# Patient Record
Sex: Female | Born: 1940 | Hispanic: Yes | Marital: Married
Health system: Midwestern US, Academic
[De-identification: ages and names within clinical notes are randomized; demographics above are authoritative.]

---

## 2016-12-25 ENCOUNTER — Encounter: Admit: 2016-12-25 | Discharge: 2016-12-26 | Payer: BC Managed Care – PPO

## 2016-12-25 ENCOUNTER — Encounter: Admit: 2016-12-25 | Discharge: 2016-12-25 | Payer: BC Managed Care – PPO

## 2016-12-25 DIAGNOSIS — R69 Illness, unspecified: Principal | ICD-10-CM

## 2017-01-09 ENCOUNTER — Encounter: Admit: 2017-01-09 | Discharge: 2017-01-10 | Payer: BC Managed Care – PPO

## 2017-01-09 ENCOUNTER — Encounter: Admit: 2017-01-09 | Discharge: 2017-01-09 | Payer: BC Managed Care – PPO

## 2017-01-09 DIAGNOSIS — R69 Illness, unspecified: Principal | ICD-10-CM

## 2017-04-23 ENCOUNTER — Encounter: Admit: 2017-04-23 | Discharge: 2017-04-23 | Payer: BC Managed Care – PPO

## 2017-04-24 ENCOUNTER — Encounter: Admit: 2017-04-24 | Discharge: 2017-04-24 | Payer: BC Managed Care – PPO

## 2017-04-24 DIAGNOSIS — R69 Illness, unspecified: Principal | ICD-10-CM

## 2017-05-01 ENCOUNTER — Encounter: Admit: 2017-05-01 | Discharge: 2017-05-01 | Payer: MEDICARE

## 2017-05-08 ENCOUNTER — Encounter: Admit: 2017-05-08 | Discharge: 2017-05-08 | Payer: BC Managed Care – PPO

## 2017-05-08 DIAGNOSIS — C50919 Malignant neoplasm of unspecified site of unspecified female breast: Principal | ICD-10-CM

## 2017-05-08 DIAGNOSIS — R51 Headache: ICD-10-CM

## 2017-05-08 DIAGNOSIS — B999 Unspecified infectious disease: ICD-10-CM

## 2017-05-08 DIAGNOSIS — G459 Transient cerebral ischemic attack, unspecified: ICD-10-CM

## 2017-05-08 DIAGNOSIS — I10 Essential (primary) hypertension: ICD-10-CM

## 2017-05-08 DIAGNOSIS — M797 Fibromyalgia: ICD-10-CM

## 2017-05-08 DIAGNOSIS — M549 Dorsalgia, unspecified: ICD-10-CM

## 2017-05-08 DIAGNOSIS — M810 Age-related osteoporosis without current pathological fracture: ICD-10-CM

## 2017-05-08 DIAGNOSIS — Z170 Estrogen receptor positive status [ER+]: ICD-10-CM

## 2017-05-08 DIAGNOSIS — D0511 Intraductal carcinoma in situ of right breast: Principal | ICD-10-CM

## 2017-05-08 DIAGNOSIS — Z780 Asymptomatic menopausal state: ICD-10-CM

## 2017-05-08 DIAGNOSIS — M1990 Unspecified osteoarthritis, unspecified site: ICD-10-CM

## 2017-05-08 DIAGNOSIS — Z853 Personal history of malignant neoplasm of breast: ICD-10-CM

## 2017-05-08 DIAGNOSIS — C801 Malignant (primary) neoplasm, unspecified: ICD-10-CM

## 2017-05-08 DIAGNOSIS — E119 Type 2 diabetes mellitus without complications: ICD-10-CM

## 2017-05-08 DIAGNOSIS — E039 Hypothyroidism, unspecified: ICD-10-CM

## 2017-05-08 DIAGNOSIS — I358 Other nonrheumatic aortic valve disorders: ICD-10-CM

## 2017-05-08 DIAGNOSIS — I253 Aneurysm of heart: ICD-10-CM

## 2017-05-08 DIAGNOSIS — E785 Hyperlipidemia, unspecified: ICD-10-CM

## 2017-05-08 DIAGNOSIS — H547 Unspecified visual loss: ICD-10-CM

## 2017-05-08 DIAGNOSIS — C539 Malignant neoplasm of cervix uteri, unspecified: ICD-10-CM

## 2017-05-08 DIAGNOSIS — Z9012 Acquired absence of left breast and nipple: ICD-10-CM

## 2017-05-08 DIAGNOSIS — Z7982 Long term (current) use of aspirin: ICD-10-CM

## 2017-05-08 DIAGNOSIS — K8020 Calculus of gallbladder without cholecystitis without obstruction: ICD-10-CM

## 2017-05-09 NOTE — Progress Notes
Name: Kimberly [place]          MRN: [id number]      DOB: 04/10/1941      AGE: 76 y.o.   DATE OF SERVICE: 05/08/2017    Subjective:             Reason for Visit:Medical oncology consultation for recently diagnosed right breast DCIS    Requesting physician: Dr. Aline Brochure    Primary care physician: Dr. Orson Mitchell    Heme/Onc Care      Kim[place]ly [place] Kimberly [place] is a 76 y.o. female.     Cancer Staging  Ductal carcinoma in situ (DCIS) of right breast  Staging form: Breast, AJCC 8th Edition  - Clinical stage from 05/08/2017: Stage 0 (cTis (DCIS), cN0, cM0, ER: Positive, PR: Positive, HER2: Not assessed ) - Signed by Kimberly Guest, MD on 05/08/2017      History of Present Illness  Kimberly [place] is a 76 year old African-American female resident of [place], [place] who is well known to me from previous history of left breast cancer in December 2000 who was referred to Dr. Verlin [place] due to newly diagnosed right breast DCIS.    She felt some right breast discomfort and underwent right breast diagnostic mammogram 12/25/16 at [facility].  Is revealed a 12 mm mass with microcalcifications in the outer and inferior aspect of the right breast.  On ultrasound this measured 11 x 9 x 12 mm at 7:00 5 cm from the nipple.  It contained calcifications.    She underwent ultrasound-guided right breast biopsy in [place] on 01/09/17.  This revealed DCIS, ER 90-100%, PR 60-70%.    She was scheduled for lumpectomy but unfortunately her 68-year-old son Gregary Signs was by a car and killed in early [place] 2018.  She rescheduled her procedure and underwent lumpectomy with Dr. Delynn Mitchell 04/17/17.  Revealed DCIS with negative margins.    She wished to reestablish with me due to her previous treatment with me in 2000 and Dr. Delynn [place] arranged for consultation.    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  Read with left mastectomy followed by adjuvant chemotherapy then 5 years of tamoxifen which transition to Femara.  She completed the Femara in 2011 or 2012.  History of hypothyroidism, diabetes, fibromyalgia, hypertension, hyperlipidemia, rheumatoid arthritis, TIA, previous cervical discectomy with fusion.    Social history: She is a never smoker.  She is married and accompanied by her husband [place].  They live in [place].  He is a Optician, dispensing for the [employer].  They have a 19 year old son Kimberly [place] and a 108 year old daughter [place].  Their 71 year old son Gregary Signs unfortunately was killed in a pedestrian car accident in [place] 2018.    Family history: Sister had non-Hodgkin's lymphoma of the throat.  A maternal cousin died of colon cancer.  No family members with breast cancer or ovarian cancer that she is aware of.  Has some relatives in [place].       Review of Systems  Diffusely chronically positive.  Decreased activity, fatigue, chills.  Congestion, dental problem causing facial swelling.  Eyeredness and photophobia.  Chest tightness cough shortness of breath wheezing.  Constipation at times.  Urinary urgency at times.  Back pain, neck pain at times.  Dizziness at times.  Headaches at times.  Weakness at times.  Some nervousness at times.  Objective:         ??? albuterol (VENTOLIN HFA, PROAIR HFA) 90 mcg/Actuation inhaler Inhale 2 Puffs by mouth Every 6 Hours as needed for Wheezing.   ??? aspirin 325 mg PO tablet Take 325 mg by mouth daily.   ??? baclofen (LIORESAL) 10 mg tablet Take 1 Tab by mouth three times daily.   ??? carvedilol (COREG) 25 mg tablet Take 1 Tab by mouth twice daily. (Patient taking differently: Take 40 mg by mouth twice daily.)   ??? diltiazem CD (CARDIZEM CD) 360 mg PO capsule Take 1 Cap by mouth daily.   ??? furosemide (LASIX) 40 mg tablet Take by mouth Daily.    ??? glimepiride (AMARYL) 4 mg tablet Take 2 Tabs by mouth Every Morning. ??? homatropine 2 % OP Drop Insert or Apply 1 Drop to right eye as directed as Needed.   ??? hydrALAzine (APRESOLINE) 50 mg tablet Take 1 Tab by mouth three times daily.   ??? Hydrocortisone 2 % TP Lotn Apply  to affected area.   ??? ibuprofen (ADVIL) 200 mg PO tablet Take 200 mg by mouth every 6 hours as needed.   ??? levothyroxine (SYNTHROID) 100 mcg tablet Take 100 mcg by mouth Daily.   ??? lisinopril (PRINIVIL, ZESTRIL) 40 mg tablet Take 1 Tab by mouth daily.   ??? nitroglycerin (NITROQUICK) 0.4 mg tablet Place 1 Tab under tongue every 5 minutes as needed for Chest Pain.   ??? potassium chloride SR (K-DUR) 20 mEq tablet Take 4 Tabs by mouth daily.   ??? PREDNISOLONE ACETATE (PRED FORTE OP) Place 1 Drop into or around eye(s) Twice Daily. Left eye   ??? PREDNISOLONE ACETATE (PRED FORTE OP) Place 1 Drop into or around eye(s) Three Times Daily. Right eye      Vitals:    05/08/17 0940   BP: 184/82   Pulse: 76   Resp: 16   Temp: 36.6 ???C (97.9 ???F)   TempSrc: Oral   SpO2: 97%   Weight: 73.2 kg (161 lb 6.4 oz)   Height: 165.1 cm (65)     Body mass index is 26.86 kg/m???.     Pain Score: Five  Pain Loc: Generalized      Pain Addressed:  Current regimen working to control pain.    Patient Evaluated for a Clinical Trial: No treatment clinical trial available for this patient.     [place] Cooperative Oncology Group performance status is 1, Restricted in physically strenuous activity but ambulatory and able to carry out work of a light or sedentary nature, e.g., light house work, office work.     Physical Exam     Very pleasant conversant Afro-American female in no distress.  HEENT: Poor dentition.  Neck: No adenopathy.  Cardiac: Regular.  Pulmonary: Clear.  Rest: Left breast surgically absent.  No chest wall nodularity or erythema.  Right breast with nicely healed lumpectomy incision at 7 o'clock position.  Slightly tender.  No palpable abnormality.  No axillary adenopathy.  Extremities: No lymphedema.  Lower extremities with trace bilateral lower extremity edema and chronic venous stasis changes.     Assessment and Plan:  Newly diagnosed right breast ductal carcinoma in situ.  She is status post lumpectomy with negative margins.  I discussed adjuvant radiotherapy with the patient and will arrange consultation with Dr. Briscoe Burns.  At completion of radiotherapy we will plan adjuvant endocrine therapy with an aromatase inhibitor.  Tamoxifen would be  contraindicated due to previous TIA.  We will obtain a  baseline DEXA scan in [place].    Due to her personal history of bilateral breast carcinoma and will arrange for genetic consultation.    I will see Shanyah in follow-up at completion of her radiotherapy to initiate adjuvant endocrine therapy with Femara or Arimidex.  We will then plan nurse practitioner follow-up for survivorship visit.    Past history of stage II node positive left breast invasive ductal carcinoma diagnosed in December 2000.  Doing well with no evidence of recurrence.    Greater than 45 minutes was spent with the patient and her husband and all their questions were answered to her satisfaction.  Greater than 45 minutes was spent reviewing outside records including radiology studies, pathology, and office notes.

## 2017-05-10 ENCOUNTER — Encounter: Admit: 2017-05-10 | Discharge: 2017-05-10 | Payer: BC Managed Care – PPO

## 2017-05-11 NOTE — Progress Notes
RADIATION ONCOLOGY PRE CONSULTATION DOCUMENTATION      CHIEF COMPLAINT:   ??? 1.7 cm, pTis N0 M0 , Stage 0,  Ductal Carcinoma of the in- Situ of the Right Breast.    HISTORY OF PRESENT ILLNESS:   Kimberly [place] is a 76 y.o. female resident of [place], [place] with a history of left breast cancer in December 2000.  Kimberly [place] felt some right breast discomfort and underwent a right breast diagnostic mammogram on 12/25/16 at [facility].  The mammogram revealed a 12 mm mass with microcalcifications in the outer and inferior aspect of the right breast.  On ultrasound this measured 11 x 9 x 12 mm at the 7:00 position, 5 cm from the nipple.  It contained calcifications.   She underwent an ultrasound- guided right breast biopsy in [place] on 01/09/17.  This revealed DCIS, ER 90-100%, PR 60-70%.  She was scheduled for a lumpectomy,  but, unfortunately her 72 year old son, [place], was killed in a pedestrian - car accident in early November 2018.   Kimberly [place] underwent a right breast lumpectomy on 04/17/17.  The pathology results are summarized in the table below.    04/17/17  Right Breast Lumpectomy  Right Breast, 7:00 Position, 5 cm From the Nipple.     Results   Tumor Size  Cell Type  Grade  Type of DCIS  Margins 1.7 cm  DCIS  Grade 2.  Cribriform Type.  Negative Margins (0.3 cm).   Nodes Sentinel Node Biopsy N/A.   Assays Estrogen Receptors  Progesterone Receptors ER+ (90 - 100%)  PR+ (60 - 70%)          LABORATORY:     IMAGING:     PATHOLOGY:    ASSESSMENT:   Bilateral Metachronous Breast Cancer.  1.7 cm, pTis N0 M0 , Stage 0,  Ductal Carcinoma of the in- Situ of the Right Breast (2018).  Location: Right Breast, 7:00 Position, 5 cm From the Nipple.  Grade 2.  Cribriform Type.  Negative Margins (0.3 cm).  ER+ (90 - 100%)  PR+ (60 - 70%)    pT1c N1 M0 , Invasive Ductal Carcinoma of the Left Breast (2001).  Negative Margins.  2 of 29 Nodes Involved.  Largest Nodal Met:  0.1 cm.  ER+ / PR+. PREVIOUS PROCEDURES / TREATMENTS:  06/06/99  Left Modified Radical Mastectomy [place]).  2011       Completed Tamoxifen followed by [place]).  01/09/17  Right Breast Core Biopsy ([place], [place]).  04/17/17  Right Breast Lumpectomy ([place]).    PLAN:  ??? Right Breast Hypofractionated Radiation to 5205 cGy.  ??? Hormonal Therapy per [place] post Radiation Therapy.

## 2017-05-13 ENCOUNTER — Ambulatory Visit: Admit: 2017-05-13 | Discharge: 2017-05-27 | Payer: BC Managed Care – PPO

## 2017-05-16 ENCOUNTER — Ambulatory Visit: Admit: 2017-05-16 | Discharge: 2017-05-16 | Payer: BC Managed Care – PPO

## 2017-05-16 ENCOUNTER — Encounter: Admit: 2017-05-16 | Discharge: 2017-05-16 | Payer: BC Managed Care – PPO

## 2017-05-16 DIAGNOSIS — E785 Hyperlipidemia, unspecified: ICD-10-CM

## 2017-05-16 DIAGNOSIS — K8020 Calculus of gallbladder without cholecystitis without obstruction: ICD-10-CM

## 2017-05-16 DIAGNOSIS — E119 Type 2 diabetes mellitus without complications: ICD-10-CM

## 2017-05-16 DIAGNOSIS — R51 Headache: ICD-10-CM

## 2017-05-16 DIAGNOSIS — I358 Other nonrheumatic aortic valve disorders: ICD-10-CM

## 2017-05-16 DIAGNOSIS — I253 Aneurysm of heart: ICD-10-CM

## 2017-05-16 DIAGNOSIS — I10 Essential (primary) hypertension: ICD-10-CM

## 2017-05-16 DIAGNOSIS — M549 Dorsalgia, unspecified: ICD-10-CM

## 2017-05-16 DIAGNOSIS — B999 Unspecified infectious disease: ICD-10-CM

## 2017-05-16 DIAGNOSIS — IMO0002 Squamous cell carcinoma: ICD-10-CM

## 2017-05-16 DIAGNOSIS — M810 Age-related osteoporosis without current pathological fracture: ICD-10-CM

## 2017-05-16 DIAGNOSIS — E039 Hypothyroidism, unspecified: ICD-10-CM

## 2017-05-16 DIAGNOSIS — M797 Fibromyalgia: ICD-10-CM

## 2017-05-16 DIAGNOSIS — C539 Malignant neoplasm of cervix uteri, unspecified: ICD-10-CM

## 2017-05-16 DIAGNOSIS — M1990 Unspecified osteoarthritis, unspecified site: ICD-10-CM

## 2017-05-16 DIAGNOSIS — H547 Unspecified visual loss: ICD-10-CM

## 2017-05-16 DIAGNOSIS — C801 Malignant (primary) neoplasm, unspecified: ICD-10-CM

## 2017-05-16 DIAGNOSIS — G459 Transient cerebral ischemic attack, unspecified: ICD-10-CM

## 2017-05-16 DIAGNOSIS — C50919 Malignant neoplasm of unspecified site of unspecified female breast: Principal | ICD-10-CM

## 2017-05-23 ENCOUNTER — Encounter: Admit: 2017-05-23 | Discharge: 2017-05-23 | Payer: BC Managed Care – PPO

## 2017-05-27 DIAGNOSIS — D0511 Intraductal carcinoma in situ of right breast: Principal | ICD-10-CM

## 2017-05-28 ENCOUNTER — Encounter: Admit: 2017-05-28 | Discharge: 2017-05-28 | Payer: BC Managed Care – PPO

## 2017-05-28 ENCOUNTER — Ambulatory Visit: Admit: 2017-05-28 | Discharge: 2017-06-12 | Payer: BC Managed Care – PPO

## 2017-05-30 ENCOUNTER — Encounter: Admit: 2017-05-30 | Discharge: 2017-05-30 | Payer: BC Managed Care – PPO

## 2017-06-05 ENCOUNTER — Encounter: Admit: 2017-06-05 | Discharge: 2017-06-05 | Payer: BC Managed Care – PPO

## 2017-06-06 ENCOUNTER — Encounter: Admit: 2017-06-06 | Discharge: 2017-06-06 | Payer: BC Managed Care – PPO

## 2017-06-09 ENCOUNTER — Encounter: Admit: 2017-06-09 | Discharge: 2017-06-09 | Payer: BC Managed Care – PPO

## 2017-06-09 DIAGNOSIS — D0511 Intraductal carcinoma in situ of right breast: Principal | ICD-10-CM

## 2017-06-10 ENCOUNTER — Encounter: Admit: 2017-06-10 | Discharge: 2017-06-10 | Payer: BC Managed Care – PPO

## 2017-06-11 ENCOUNTER — Encounter: Admit: 2017-06-11 | Discharge: 2017-06-11 | Payer: BC Managed Care – PPO

## 2017-06-12 ENCOUNTER — Encounter: Admit: 2017-06-12 | Discharge: 2017-06-12 | Payer: BC Managed Care – PPO

## 2017-06-13 ENCOUNTER — Ambulatory Visit: Admit: 2017-06-13 | Discharge: 2017-06-27 | Payer: BC Managed Care – PPO

## 2017-06-13 ENCOUNTER — Encounter: Admit: 2017-06-13 | Discharge: 2017-06-13 | Payer: BC Managed Care – PPO

## 2017-06-16 ENCOUNTER — Encounter: Admit: 2017-06-16 | Discharge: 2017-06-16 | Payer: BC Managed Care – PPO

## 2017-06-17 ENCOUNTER — Encounter: Admit: 2017-06-17 | Discharge: 2017-06-17 | Payer: BC Managed Care – PPO

## 2017-06-18 ENCOUNTER — Encounter: Admit: 2017-06-18 | Discharge: 2017-06-18 | Payer: BC Managed Care – PPO

## 2017-06-19 ENCOUNTER — Encounter: Admit: 2017-06-19 | Discharge: 2017-06-19 | Payer: BC Managed Care – PPO

## 2017-06-20 ENCOUNTER — Encounter: Admit: 2017-06-20 | Discharge: 2017-06-20 | Payer: BC Managed Care – PPO

## 2017-06-23 ENCOUNTER — Encounter: Admit: 2017-06-23 | Discharge: 2017-06-23 | Payer: BC Managed Care – PPO

## 2017-06-24 ENCOUNTER — Encounter: Admit: 2017-06-24 | Discharge: 2017-06-24 | Payer: BC Managed Care – PPO

## 2017-06-25 ENCOUNTER — Encounter: Admit: 2017-06-25 | Discharge: 2017-06-25 | Payer: BC Managed Care – PPO

## 2017-06-26 ENCOUNTER — Encounter: Admit: 2017-06-26 | Discharge: 2017-06-26 | Payer: BC Managed Care – PPO

## 2017-06-27 DIAGNOSIS — D0511 Intraductal carcinoma in situ of right breast: Principal | ICD-10-CM

## 2017-06-28 ENCOUNTER — Ambulatory Visit: Admit: 2017-06-28 | Discharge: 2017-07-10 | Payer: BC Managed Care – PPO

## 2017-06-30 ENCOUNTER — Encounter: Admit: 2017-06-30 | Discharge: 2017-06-30 | Payer: BC Managed Care – PPO

## 2017-06-30 DIAGNOSIS — G459 Transient cerebral ischemic attack, unspecified: ICD-10-CM

## 2017-06-30 DIAGNOSIS — M810 Age-related osteoporosis without current pathological fracture: ICD-10-CM

## 2017-06-30 DIAGNOSIS — I358 Other nonrheumatic aortic valve disorders: ICD-10-CM

## 2017-06-30 DIAGNOSIS — I253 Aneurysm of heart: ICD-10-CM

## 2017-06-30 DIAGNOSIS — E119 Type 2 diabetes mellitus without complications: ICD-10-CM

## 2017-06-30 DIAGNOSIS — E785 Hyperlipidemia, unspecified: ICD-10-CM

## 2017-06-30 DIAGNOSIS — R51 Headache: ICD-10-CM

## 2017-06-30 DIAGNOSIS — K8020 Calculus of gallbladder without cholecystitis without obstruction: ICD-10-CM

## 2017-06-30 DIAGNOSIS — M549 Dorsalgia, unspecified: ICD-10-CM

## 2017-06-30 DIAGNOSIS — E039 Hypothyroidism, unspecified: ICD-10-CM

## 2017-06-30 DIAGNOSIS — I10 Essential (primary) hypertension: ICD-10-CM

## 2017-06-30 DIAGNOSIS — C801 Malignant (primary) neoplasm, unspecified: ICD-10-CM

## 2017-06-30 DIAGNOSIS — M1990 Unspecified osteoarthritis, unspecified site: ICD-10-CM

## 2017-06-30 DIAGNOSIS — H547 Unspecified visual loss: ICD-10-CM

## 2017-06-30 DIAGNOSIS — M797 Fibromyalgia: ICD-10-CM

## 2017-06-30 DIAGNOSIS — C50919 Malignant neoplasm of unspecified site of unspecified female breast: Principal | ICD-10-CM

## 2017-06-30 DIAGNOSIS — B999 Unspecified infectious disease: ICD-10-CM

## 2017-06-30 DIAGNOSIS — C539 Malignant neoplasm of cervix uteri, unspecified: ICD-10-CM

## 2017-07-01 ENCOUNTER — Encounter: Admit: 2017-07-01 | Discharge: 2017-07-01 | Payer: BC Managed Care – PPO

## 2017-07-02 ENCOUNTER — Encounter: Admit: 2017-07-02 | Discharge: 2017-07-02 | Payer: BC Managed Care – PPO

## 2017-07-03 ENCOUNTER — Encounter: Admit: 2017-07-03 | Discharge: 2017-07-03 | Payer: BC Managed Care – PPO

## 2017-07-04 ENCOUNTER — Encounter: Admit: 2017-07-04 | Discharge: 2017-07-04 | Payer: BC Managed Care – PPO

## 2017-07-10 DIAGNOSIS — D0511 Intraductal carcinoma in situ of right breast: Principal | ICD-10-CM

## 2017-07-14 ENCOUNTER — Encounter: Admit: 2017-07-14 | Discharge: 2017-07-14 | Payer: BC Managed Care – PPO

## 2017-07-14 DIAGNOSIS — R51 Headache: ICD-10-CM

## 2017-07-14 DIAGNOSIS — E119 Type 2 diabetes mellitus without complications: ICD-10-CM

## 2017-07-14 DIAGNOSIS — C50912 Malignant neoplasm of unspecified site of left female breast: ICD-10-CM

## 2017-07-14 DIAGNOSIS — D0511 Intraductal carcinoma in situ of right breast: Principal | ICD-10-CM

## 2017-07-14 DIAGNOSIS — C539 Malignant neoplasm of cervix uteri, unspecified: ICD-10-CM

## 2017-07-14 DIAGNOSIS — C801 Malignant (primary) neoplasm, unspecified: ICD-10-CM

## 2017-07-14 DIAGNOSIS — M797 Fibromyalgia: ICD-10-CM

## 2017-07-14 DIAGNOSIS — M810 Age-related osteoporosis without current pathological fracture: ICD-10-CM

## 2017-07-14 DIAGNOSIS — M549 Dorsalgia, unspecified: ICD-10-CM

## 2017-07-14 DIAGNOSIS — E039 Hypothyroidism, unspecified: ICD-10-CM

## 2017-07-14 DIAGNOSIS — Z9013 Acquired absence of bilateral breasts and nipples: ICD-10-CM

## 2017-07-14 DIAGNOSIS — H547 Unspecified visual loss: ICD-10-CM

## 2017-07-14 DIAGNOSIS — I10 Essential (primary) hypertension: ICD-10-CM

## 2017-07-14 DIAGNOSIS — I358 Other nonrheumatic aortic valve disorders: ICD-10-CM

## 2017-07-14 DIAGNOSIS — K8020 Calculus of gallbladder without cholecystitis without obstruction: ICD-10-CM

## 2017-07-14 DIAGNOSIS — Z170 Estrogen receptor positive status [ER+]: ICD-10-CM

## 2017-07-14 DIAGNOSIS — I253 Aneurysm of heart: ICD-10-CM

## 2017-07-14 DIAGNOSIS — G459 Transient cerebral ischemic attack, unspecified: ICD-10-CM

## 2017-07-14 DIAGNOSIS — B999 Unspecified infectious disease: ICD-10-CM

## 2017-07-14 DIAGNOSIS — E785 Hyperlipidemia, unspecified: ICD-10-CM

## 2017-07-14 DIAGNOSIS — M1990 Unspecified osteoarthritis, unspecified site: ICD-10-CM

## 2017-07-14 DIAGNOSIS — C50919 Malignant neoplasm of unspecified site of unspecified female breast: Principal | ICD-10-CM

## 2017-07-14 DIAGNOSIS — Z79811 Long term (current) use of aromatase inhibitors: ICD-10-CM

## 2017-07-14 MED ORDER — LETROZOLE 2.5 MG PO TAB
2.5 mg | ORAL_TABLET | Freq: Every day | ORAL | 3 refills | 16.50000 days | Status: AC
Start: 2017-07-14 — End: 2018-08-04

## 2017-07-26 ENCOUNTER — Ambulatory Visit: Admit: 2017-07-26 | Discharge: 2017-08-10 | Payer: BC Managed Care – PPO

## 2017-08-11 ENCOUNTER — Ambulatory Visit: Admit: 2017-08-11 | Discharge: 2017-08-25 | Payer: BC Managed Care – PPO

## 2017-08-20 ENCOUNTER — Encounter: Admit: 2017-08-20 | Discharge: 2017-08-20 | Payer: BC Managed Care – PPO

## 2017-08-20 DIAGNOSIS — M1990 Unspecified osteoarthritis, unspecified site: ICD-10-CM

## 2017-08-20 DIAGNOSIS — K8020 Calculus of gallbladder without cholecystitis without obstruction: ICD-10-CM

## 2017-08-20 DIAGNOSIS — M810 Age-related osteoporosis without current pathological fracture: ICD-10-CM

## 2017-08-20 DIAGNOSIS — M549 Dorsalgia, unspecified: ICD-10-CM

## 2017-08-20 DIAGNOSIS — I253 Aneurysm of heart: ICD-10-CM

## 2017-08-20 DIAGNOSIS — C539 Malignant neoplasm of cervix uteri, unspecified: ICD-10-CM

## 2017-08-20 DIAGNOSIS — E039 Hypothyroidism, unspecified: ICD-10-CM

## 2017-08-20 DIAGNOSIS — E785 Hyperlipidemia, unspecified: ICD-10-CM

## 2017-08-20 DIAGNOSIS — M797 Fibromyalgia: ICD-10-CM

## 2017-08-20 DIAGNOSIS — C801 Malignant (primary) neoplasm, unspecified: ICD-10-CM

## 2017-08-20 DIAGNOSIS — B999 Unspecified infectious disease: ICD-10-CM

## 2017-08-20 DIAGNOSIS — I10 Essential (primary) hypertension: ICD-10-CM

## 2017-08-20 DIAGNOSIS — R51 Headache: ICD-10-CM

## 2017-08-20 DIAGNOSIS — I358 Other nonrheumatic aortic valve disorders: ICD-10-CM

## 2017-08-20 DIAGNOSIS — H547 Unspecified visual loss: ICD-10-CM

## 2017-08-20 DIAGNOSIS — G459 Transient cerebral ischemic attack, unspecified: ICD-10-CM

## 2017-08-20 DIAGNOSIS — E119 Type 2 diabetes mellitus without complications: ICD-10-CM

## 2017-08-20 DIAGNOSIS — C50919 Malignant neoplasm of unspecified site of unspecified female breast: Principal | ICD-10-CM

## 2017-08-25 ENCOUNTER — Ambulatory Visit: Admit: 2017-08-20 | Discharge: 2017-08-20 | Payer: BC Managed Care – PPO

## 2017-08-25 DIAGNOSIS — D0511 Intraductal carcinoma in situ of right breast: ICD-10-CM

## 2017-08-25 DIAGNOSIS — Z08 Encounter for follow-up examination after completed treatment for malignant neoplasm: Principal | ICD-10-CM

## 2017-09-15 ENCOUNTER — Encounter: Admit: 2017-09-15 | Discharge: 2017-09-15 | Payer: BC Managed Care – PPO

## 2018-02-12 ENCOUNTER — Encounter: Admit: 2018-02-12 | Discharge: 2018-02-12 | Payer: BC Managed Care – PPO

## 2018-02-12 DIAGNOSIS — C773 Secondary and unspecified malignant neoplasm of axilla and upper limb lymph nodes: ICD-10-CM

## 2018-02-12 DIAGNOSIS — D0511 Intraductal carcinoma in situ of right breast: Secondary | ICD-10-CM

## 2018-02-12 DIAGNOSIS — R51 Headache: ICD-10-CM

## 2018-02-12 DIAGNOSIS — I358 Other nonrheumatic aortic valve disorders: ICD-10-CM

## 2018-02-12 DIAGNOSIS — E11649 Type 2 diabetes mellitus with hypoglycemia without coma: ICD-10-CM

## 2018-02-12 DIAGNOSIS — R0781 Pleurodynia: ICD-10-CM

## 2018-02-12 DIAGNOSIS — Z170 Estrogen receptor positive status [ER+]: ICD-10-CM

## 2018-02-12 DIAGNOSIS — R42 Dizziness and giddiness: ICD-10-CM

## 2018-02-12 DIAGNOSIS — M79601 Pain in right arm: ICD-10-CM

## 2018-02-12 DIAGNOSIS — Z79811 Long term (current) use of aromatase inhibitors: ICD-10-CM

## 2018-02-12 DIAGNOSIS — E119 Type 2 diabetes mellitus without complications: ICD-10-CM

## 2018-02-12 DIAGNOSIS — C50919 Malignant neoplasm of unspecified site of unspecified female breast: Principal | ICD-10-CM

## 2018-02-12 DIAGNOSIS — I10 Essential (primary) hypertension: ICD-10-CM

## 2018-02-12 DIAGNOSIS — N644 Mastodynia: ICD-10-CM

## 2018-02-12 DIAGNOSIS — J449 Chronic obstructive pulmonary disease, unspecified: ICD-10-CM

## 2018-02-12 DIAGNOSIS — Z9012 Acquired absence of left breast and nipple: ICD-10-CM

## 2018-02-12 DIAGNOSIS — Z7984 Long term (current) use of oral hypoglycemic drugs: ICD-10-CM

## 2018-02-12 DIAGNOSIS — J431 Panlobular emphysema: ICD-10-CM

## 2018-02-12 DIAGNOSIS — C50912 Malignant neoplasm of unspecified site of left female breast: ICD-10-CM

## 2018-02-12 DIAGNOSIS — E785 Hyperlipidemia, unspecified: ICD-10-CM

## 2018-02-12 DIAGNOSIS — K8020 Calculus of gallbladder without cholecystitis without obstruction: ICD-10-CM

## 2018-02-12 DIAGNOSIS — R350 Frequency of micturition: ICD-10-CM

## 2018-02-12 DIAGNOSIS — N39498 Other specified urinary incontinence: ICD-10-CM

## 2018-02-12 DIAGNOSIS — Z853 Personal history of malignant neoplasm of breast: ICD-10-CM

## 2018-02-12 DIAGNOSIS — G459 Transient cerebral ischemic attack, unspecified: ICD-10-CM

## 2018-02-12 DIAGNOSIS — I253 Aneurysm of heart: ICD-10-CM

## 2018-02-12 DIAGNOSIS — M549 Dorsalgia, unspecified: ICD-10-CM

## 2018-02-12 DIAGNOSIS — M797 Fibromyalgia: ICD-10-CM

## 2018-02-12 DIAGNOSIS — E039 Hypothyroidism, unspecified: ICD-10-CM

## 2018-02-12 DIAGNOSIS — B999 Unspecified infectious disease: ICD-10-CM

## 2018-02-12 DIAGNOSIS — M1990 Unspecified osteoarthritis, unspecified site: ICD-10-CM

## 2018-02-12 DIAGNOSIS — C801 Malignant (primary) neoplasm, unspecified: ICD-10-CM

## 2018-02-12 DIAGNOSIS — H547 Unspecified visual loss: ICD-10-CM

## 2018-02-12 DIAGNOSIS — M810 Age-related osteoporosis without current pathological fracture: ICD-10-CM

## 2018-02-12 DIAGNOSIS — C539 Malignant neoplasm of cervix uteri, unspecified: ICD-10-CM

## 2018-02-12 LAB — CBC AND DIFF
Lab: 1 % (ref >60)
Lab: 13 g/dL (ref 12.0–15.0)
Lab: 14 % (ref 11–15)
Lab: 177 10*3/uL (ref 150–400)
Lab: 2.5 10*3/uL (ref 1.8–7.0)
Lab: 29 pg (ref 26–34)
Lab: 33 g/dL (ref 32.0–36.0)
Lab: 39 % (ref 36–45)
Lab: 4.3 10*3/uL — ABNORMAL LOW (ref >60)
Lab: 4.4 M/UL — ABNORMAL HIGH (ref >60)
Lab: 5 % (ref >60)
Lab: 58 % (ref 41–77)
Lab: 8.6 FL (ref 7–11)
Lab: 87 FL (ref 80–100)
Lab: 9 % (ref 4–12)

## 2018-02-13 LAB — COMPREHENSIVE METABOLIC PANEL
Lab: 140 MMOL/L (ref 137–147)
Lab: 3.2 MMOL/L — ABNORMAL LOW (ref 3.5–5.1)

## 2018-02-16 LAB — CA-27.29: Lab: 34 U/mL (ref 7–11)

## 2018-03-10 ENCOUNTER — Encounter: Admit: 2018-03-10 | Discharge: 2018-03-10 | Payer: BC Managed Care – PPO

## 2018-03-10 DIAGNOSIS — D0511 Intraductal carcinoma in situ of right breast: Principal | ICD-10-CM

## 2018-05-18 ENCOUNTER — Encounter: Admit: 2018-05-18 | Discharge: 2018-05-18 | Payer: BC Managed Care – PPO

## 2018-05-24 IMAGING — CR CHEST
1 series · 1 of 1 positions shown · non-contrast
Comparison: none

[chest port x-wise]
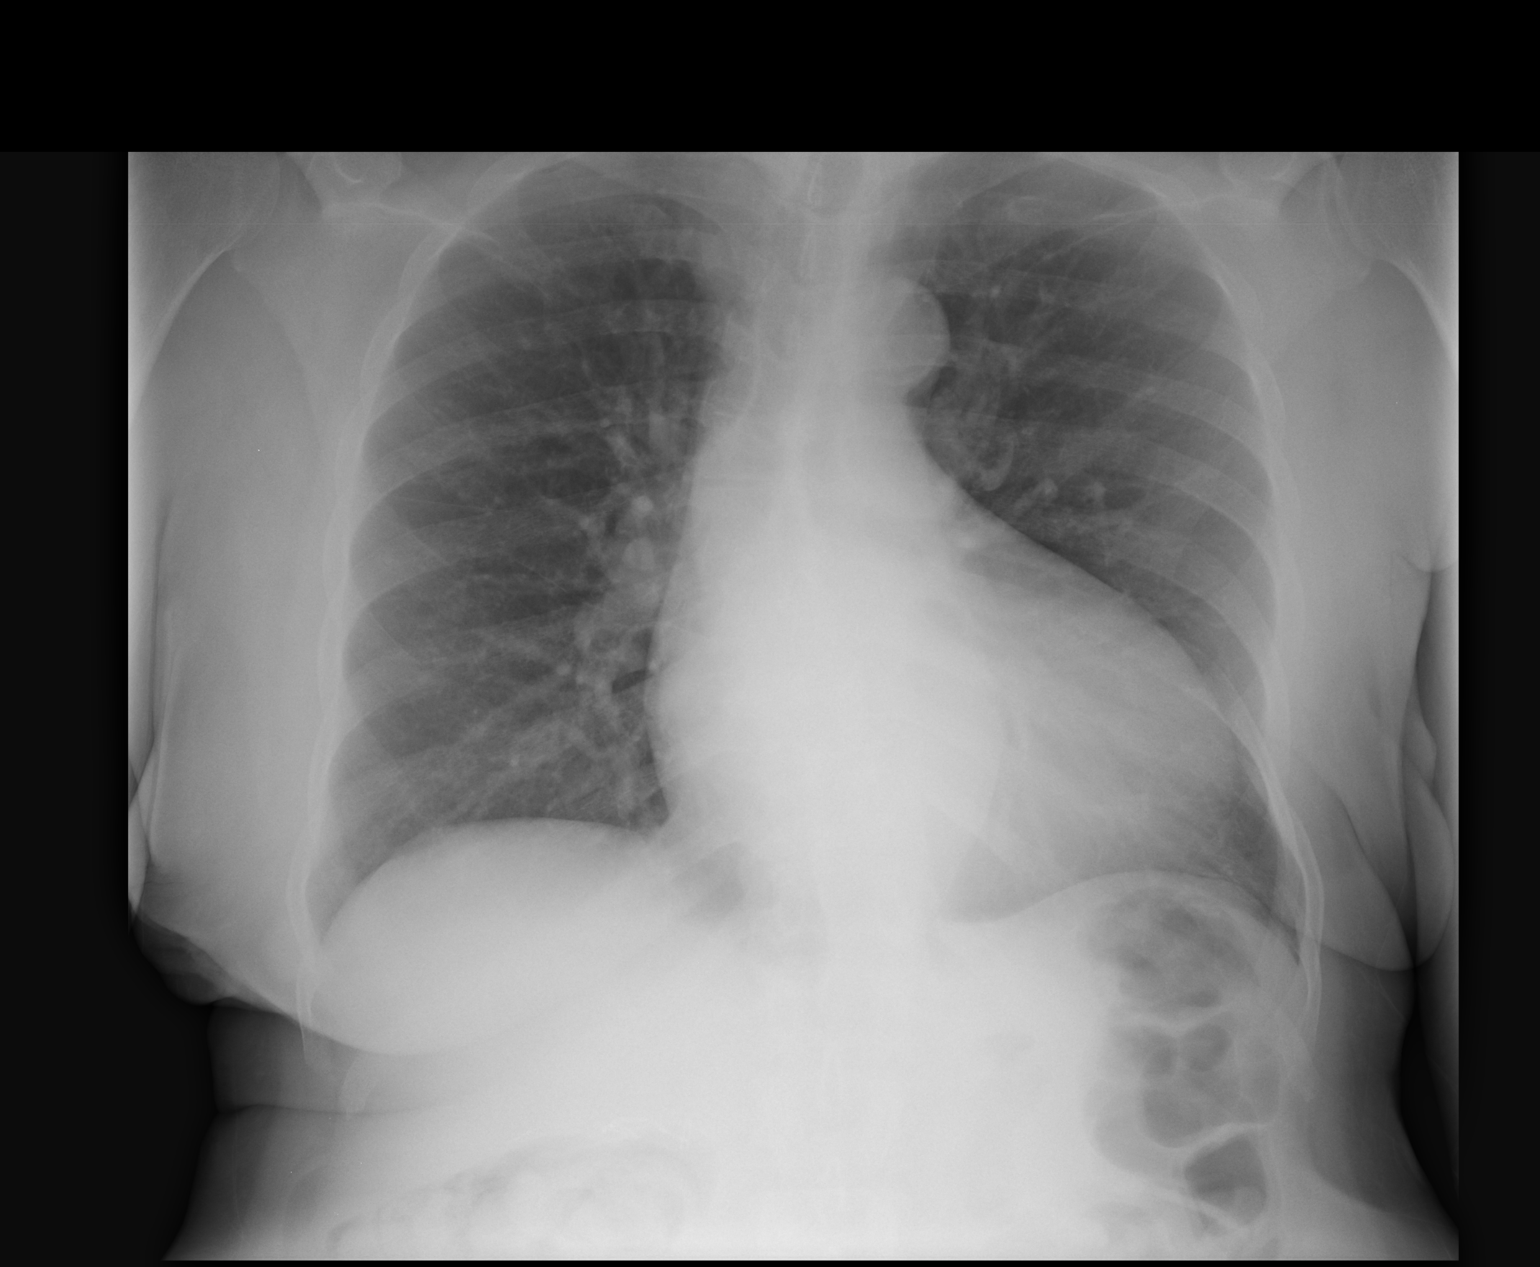

[1 of 1 positions shown; findings below may reference images not displayed]

DIAGNOSTIC STUDIES

EXAM

RADIOLOGICAL EXAMINATION, CHEST; SINGLE VIEW, FRONTAL CPT 00101

INDICATION

uncontrolled HTN
UNCONTROLLED HYPERTENSION. SB/CK

TECHNIQUE

Single AP view chest

COMPARISONS

No prior studies are available for comparison.

FINDINGS

There is no focal consolidation, effusion, or pneumothorax. The cardiac silhouette is mildly
prominent. The bony thorax is intact.

IMPRESSION

No acute cardiopulmonary abnormality.

## 2018-08-04 ENCOUNTER — Encounter: Admit: 2018-08-04 | Discharge: 2018-08-04 | Payer: BC Managed Care – PPO

## 2018-08-04 MED ORDER — LETROZOLE 2.5 MG PO TAB
ORAL_TABLET | Freq: Every day | ORAL | 1 refills | 16.50000 days | Status: AC
Start: 2018-08-04 — End: 2019-08-17

## 2018-08-12 ENCOUNTER — Encounter: Admit: 2018-08-12 | Discharge: 2018-08-12 | Payer: BC Managed Care – PPO

## 2019-04-16 ENCOUNTER — Encounter: Admit: 2019-04-16 | Discharge: 2019-04-16 | Payer: BC Managed Care – PPO

## 2019-04-16 ENCOUNTER — Ambulatory Visit: Admit: 2019-04-16 | Discharge: 2019-04-16 | Payer: BC Managed Care – PPO

## 2019-04-16 DIAGNOSIS — I313 Pericardial effusion (noninflammatory): Secondary | ICD-10-CM

## 2019-04-16 DIAGNOSIS — I10 Essential (primary) hypertension: Secondary | ICD-10-CM

## 2019-07-20 ENCOUNTER — Encounter: Admit: 2019-07-20 | Discharge: 2019-07-20 | Payer: BC Managed Care – PPO

## 2019-07-20 ENCOUNTER — Ambulatory Visit: Admit: 2019-07-20 | Discharge: 2019-07-20 | Payer: BC Managed Care – PPO

## 2019-07-21 ENCOUNTER — Encounter: Admit: 2019-07-21 | Discharge: 2019-07-21 | Payer: BC Managed Care – PPO

## 2019-08-12 ENCOUNTER — Encounter: Admit: 2019-08-12 | Discharge: 2019-08-12 | Payer: BC Managed Care – PPO

## 2019-08-17 ENCOUNTER — Encounter: Admit: 2019-08-17 | Discharge: 2019-08-17 | Payer: BC Managed Care – PPO

## 2019-08-17 DIAGNOSIS — C539 Malignant neoplasm of cervix uteri, unspecified: Secondary | ICD-10-CM

## 2019-08-17 DIAGNOSIS — C50919 Malignant neoplasm of unspecified site of unspecified female breast: Secondary | ICD-10-CM

## 2019-08-17 DIAGNOSIS — E119 Type 2 diabetes mellitus without complications: Secondary | ICD-10-CM

## 2019-08-17 DIAGNOSIS — R0600 Dyspnea, unspecified: Secondary | ICD-10-CM

## 2019-08-17 DIAGNOSIS — G459 Transient cerebral ischemic attack, unspecified: Secondary | ICD-10-CM

## 2019-08-17 DIAGNOSIS — M797 Fibromyalgia: Secondary | ICD-10-CM

## 2019-08-17 DIAGNOSIS — H547 Unspecified visual loss: Secondary | ICD-10-CM

## 2019-08-17 DIAGNOSIS — I253 Aneurysm of heart: Secondary | ICD-10-CM

## 2019-08-17 DIAGNOSIS — I10 Essential (primary) hypertension: Secondary | ICD-10-CM

## 2019-08-17 DIAGNOSIS — B999 Unspecified infectious disease: Secondary | ICD-10-CM

## 2019-08-17 DIAGNOSIS — I351 Nonrheumatic aortic (valve) insufficiency: Secondary | ICD-10-CM

## 2019-08-17 DIAGNOSIS — R51 Headache: Secondary | ICD-10-CM

## 2019-08-17 DIAGNOSIS — E039 Hypothyroidism, unspecified: Secondary | ICD-10-CM

## 2019-08-17 DIAGNOSIS — K8020 Calculus of gallbladder without cholecystitis without obstruction: Secondary | ICD-10-CM

## 2019-08-17 DIAGNOSIS — M810 Age-related osteoporosis without current pathological fracture: Secondary | ICD-10-CM

## 2019-08-17 DIAGNOSIS — I358 Other nonrheumatic aortic valve disorders: Secondary | ICD-10-CM

## 2019-08-17 DIAGNOSIS — M549 Dorsalgia, unspecified: Secondary | ICD-10-CM

## 2019-08-17 DIAGNOSIS — E785 Hyperlipidemia, unspecified: Secondary | ICD-10-CM

## 2019-08-17 DIAGNOSIS — C801 Malignant (primary) neoplasm, unspecified: Secondary | ICD-10-CM

## 2019-08-17 DIAGNOSIS — M1990 Unspecified osteoarthritis, unspecified site: Secondary | ICD-10-CM

## 2019-08-17 DIAGNOSIS — R079 Chest pain, unspecified: Secondary | ICD-10-CM

## 2019-08-17 DIAGNOSIS — J431 Panlobular emphysema: Secondary | ICD-10-CM

## 2019-08-17 MED ORDER — ALBUTEROL SULFATE 90 MCG/ACTUATION IN HFAA
2 | RESPIRATORY_TRACT | 0 refills | Status: AC | PRN
Start: 2019-08-17 — End: ?

## 2019-08-20 ENCOUNTER — Encounter: Admit: 2019-08-20 | Discharge: 2019-08-20 | Payer: BC Managed Care – PPO

## 2019-08-20 NOTE — Telephone Encounter
Received a call from [facility] scheduling stating that they have not been able to reach pt to schedule pulse ox with exercise that Dr. Aris Georgia ordered. Pt can call [phone] to schedule if test is still needed. I called pt and left a message asking her to call to schedule and provided her with the phone number.

## 2019-08-23 ENCOUNTER — Encounter: Admit: 2019-08-23 | Discharge: 2019-08-23 | Payer: BC Managed Care – PPO

## 2019-08-23 DIAGNOSIS — Z20822 Encounter for screening laboratory testing for COVID-19 virus in asymptomatic patient: Secondary | ICD-10-CM

## 2019-08-23 DIAGNOSIS — I340 Nonrheumatic mitral (valve) insufficiency: Secondary | ICD-10-CM

## 2019-08-24 ENCOUNTER — Encounter: Admit: 2019-08-24 | Discharge: 2019-08-24 | Payer: BC Managed Care – PPO

## 2019-08-26 ENCOUNTER — Ambulatory Visit: Admit: 2019-08-26 | Discharge: 2019-08-26 | Payer: BC Managed Care – PPO

## 2019-08-26 ENCOUNTER — Encounter: Admit: 2019-08-26 | Discharge: 2019-08-26 | Payer: BC Managed Care – PPO

## 2019-08-26 DIAGNOSIS — I10 Essential (primary) hypertension: Secondary | ICD-10-CM

## 2019-08-26 DIAGNOSIS — I351 Nonrheumatic aortic (valve) insufficiency: Secondary | ICD-10-CM

## 2019-08-26 DIAGNOSIS — M797 Fibromyalgia: Secondary | ICD-10-CM

## 2019-08-30 NOTE — Telephone Encounter
Left message with amyloid scan results and callback number for further questions or concerns.

## 2019-08-30 NOTE — Telephone Encounter
-----   Message from Glade Stanford, RN sent at 08/30/2019  7:34 AM CDT -----    ----- Message -----  From: Laurence Aly, MD  Sent: 08/28/2019   7:26 AM CDT  To: Renetta Chalk, RN, #    Let pt know this was negative.

## 2019-10-05 ENCOUNTER — Encounter: Admit: 2019-10-05 | Discharge: 2019-10-05 | Payer: MEDICARE

## 2019-10-05 DIAGNOSIS — M1990 Unspecified osteoarthritis, unspecified site: Secondary | ICD-10-CM

## 2019-10-05 DIAGNOSIS — C50919 Malignant neoplasm of unspecified site of unspecified female breast: Secondary | ICD-10-CM

## 2019-10-05 DIAGNOSIS — R0600 Dyspnea, unspecified: Secondary | ICD-10-CM

## 2019-10-05 DIAGNOSIS — I10 Essential (primary) hypertension: Secondary | ICD-10-CM

## 2019-10-05 DIAGNOSIS — J431 Panlobular emphysema: Secondary | ICD-10-CM

## 2019-10-05 DIAGNOSIS — C539 Malignant neoplasm of cervix uteri, unspecified: Secondary | ICD-10-CM

## 2019-10-05 DIAGNOSIS — K8020 Calculus of gallbladder without cholecystitis without obstruction: Secondary | ICD-10-CM

## 2019-10-05 DIAGNOSIS — I358 Other nonrheumatic aortic valve disorders: Secondary | ICD-10-CM

## 2019-10-05 DIAGNOSIS — I253 Aneurysm of heart: Secondary | ICD-10-CM

## 2019-10-05 DIAGNOSIS — E039 Hypothyroidism, unspecified: Secondary | ICD-10-CM

## 2019-10-05 DIAGNOSIS — I00 Rheumatic fever without heart involvement: Secondary | ICD-10-CM

## 2019-10-05 DIAGNOSIS — M549 Dorsalgia, unspecified: Secondary | ICD-10-CM

## 2019-10-05 DIAGNOSIS — B999 Unspecified infectious disease: Secondary | ICD-10-CM

## 2019-10-05 DIAGNOSIS — C801 Malignant (primary) neoplasm, unspecified: Secondary | ICD-10-CM

## 2019-10-05 DIAGNOSIS — E785 Hyperlipidemia, unspecified: Secondary | ICD-10-CM

## 2019-10-05 DIAGNOSIS — M797 Fibromyalgia: Secondary | ICD-10-CM

## 2019-10-05 DIAGNOSIS — G459 Transient cerebral ischemic attack, unspecified: Secondary | ICD-10-CM

## 2019-10-05 DIAGNOSIS — R51 Headache: Secondary | ICD-10-CM

## 2019-10-05 DIAGNOSIS — H547 Unspecified visual loss: Secondary | ICD-10-CM

## 2019-10-05 DIAGNOSIS — M810 Age-related osteoporosis without current pathological fracture: Secondary | ICD-10-CM

## 2019-10-05 DIAGNOSIS — E119 Type 2 diabetes mellitus without complications: Secondary | ICD-10-CM

## 2019-10-07 ENCOUNTER — Encounter: Admit: 2019-10-07 | Discharge: 2019-10-07 | Payer: MEDICARE

## 2019-10-07 DIAGNOSIS — I10 Essential (primary) hypertension: Secondary | ICD-10-CM

## 2019-10-07 DIAGNOSIS — H547 Unspecified visual loss: Secondary | ICD-10-CM

## 2019-10-07 DIAGNOSIS — I272 Pulmonary hypertension, unspecified: Secondary | ICD-10-CM

## 2019-10-07 DIAGNOSIS — C539 Malignant neoplasm of cervix uteri, unspecified: Secondary | ICD-10-CM

## 2019-10-07 DIAGNOSIS — R51 Headache: Secondary | ICD-10-CM

## 2019-10-07 DIAGNOSIS — I358 Other nonrheumatic aortic valve disorders: Secondary | ICD-10-CM

## 2019-10-07 DIAGNOSIS — C50919 Malignant neoplasm of unspecified site of unspecified female breast: Secondary | ICD-10-CM

## 2019-10-07 DIAGNOSIS — B999 Unspecified infectious disease: Secondary | ICD-10-CM

## 2019-10-07 DIAGNOSIS — E039 Hypothyroidism, unspecified: Secondary | ICD-10-CM

## 2019-10-07 DIAGNOSIS — M797 Fibromyalgia: Secondary | ICD-10-CM

## 2019-10-07 DIAGNOSIS — I00 Rheumatic fever without heart involvement: Secondary | ICD-10-CM

## 2019-10-07 DIAGNOSIS — M549 Dorsalgia, unspecified: Secondary | ICD-10-CM

## 2019-10-07 DIAGNOSIS — K8020 Calculus of gallbladder without cholecystitis without obstruction: Secondary | ICD-10-CM

## 2019-10-07 DIAGNOSIS — I351 Nonrheumatic aortic (valve) insufficiency: Secondary | ICD-10-CM

## 2019-10-07 DIAGNOSIS — G459 Transient cerebral ischemic attack, unspecified: Secondary | ICD-10-CM

## 2019-10-07 DIAGNOSIS — R0600 Dyspnea, unspecified: Secondary | ICD-10-CM

## 2019-10-07 DIAGNOSIS — M810 Age-related osteoporosis without current pathological fracture: Secondary | ICD-10-CM

## 2019-10-07 DIAGNOSIS — I253 Aneurysm of heart: Secondary | ICD-10-CM

## 2019-10-07 DIAGNOSIS — E785 Hyperlipidemia, unspecified: Secondary | ICD-10-CM

## 2019-10-07 DIAGNOSIS — E119 Type 2 diabetes mellitus without complications: Secondary | ICD-10-CM

## 2019-10-07 DIAGNOSIS — C801 Malignant (primary) neoplasm, unspecified: Secondary | ICD-10-CM

## 2019-10-07 DIAGNOSIS — M1990 Unspecified osteoarthritis, unspecified site: Secondary | ICD-10-CM

## 2019-10-07 DIAGNOSIS — J431 Panlobular emphysema: Secondary | ICD-10-CM

## 2019-10-07 MED ORDER — LISINOPRIL 40 MG PO TAB
40 mg | ORAL_TABLET | Freq: Every day | ORAL | 3 refills | Status: AC
Start: 2019-10-07 — End: ?

## 2019-10-07 MED ORDER — SPIRONOLACTONE 25 MG PO TAB
25 mg | ORAL_TABLET | Freq: Every day | ORAL | 3 refills | 46.00000 days | Status: AC
Start: 2019-10-07 — End: ?

## 2019-10-07 NOTE — Telephone Encounter
Message received from [facility] pharmacy requesting clarification for spironolactone an lisinopril refills d/t potential risk for hyperkalema. Also informed pt is on potassium supplement.    Returned call to pharmacy and confirmed with [place], potassium was discontinued and lisinopril was prescribed at 40 mg daily and spironolactone 25 mg daily. Electrolytes and renal function monitored. She will update their records.

## 2019-10-07 NOTE — Patient Instructions
1. Med changes today:  Increase lisinopril to 40mg ; start Aldactone 25 mg.   2. Stop potassium.  3. Please write down your weight, blood pressure, and heart rate daily and bring this log to all clinic visits.  WE WILL PROVIDE YOU A BP CUFF and SCALE.  4. Labs in 10-14 days.: BNP, BMP, Mg.  5. Return to see Dr. Geronimo Boot or Daemian Gahm in 1-2 months.  6. Refer back to valve clinic, call to hopefully schedule on a day she has another [facility] appointment such as for next mammogram.    My nurse's Jeri Modena and [place]) number is [phone].  Please call the office with any questions or concerns [phone] (nurse triage).  To schedule or change an appointment call [phone].    Baldo Ash, MD, PhD  [facility] Phone: [phone] Fax: [phone]

## 2019-10-17 ENCOUNTER — Encounter: Admit: 2019-10-17 | Discharge: 2019-10-17 | Payer: MEDICARE

## 2019-10-17 DIAGNOSIS — B999 Unspecified infectious disease: Secondary | ICD-10-CM

## 2019-10-17 DIAGNOSIS — M1990 Unspecified osteoarthritis, unspecified site: Secondary | ICD-10-CM

## 2019-10-17 DIAGNOSIS — G459 Transient cerebral ischemic attack, unspecified: Secondary | ICD-10-CM

## 2019-10-17 DIAGNOSIS — R0600 Dyspnea, unspecified: Secondary | ICD-10-CM

## 2019-10-17 DIAGNOSIS — E785 Hyperlipidemia, unspecified: Secondary | ICD-10-CM

## 2019-10-17 DIAGNOSIS — E039 Hypothyroidism, unspecified: Secondary | ICD-10-CM

## 2019-10-17 DIAGNOSIS — I358 Other nonrheumatic aortic valve disorders: Secondary | ICD-10-CM

## 2019-10-17 DIAGNOSIS — H547 Unspecified visual loss: Secondary | ICD-10-CM

## 2019-10-17 DIAGNOSIS — C539 Malignant neoplasm of cervix uteri, unspecified: Secondary | ICD-10-CM

## 2019-10-17 DIAGNOSIS — I10 Essential (primary) hypertension: Secondary | ICD-10-CM

## 2019-10-17 DIAGNOSIS — C50919 Malignant neoplasm of unspecified site of unspecified female breast: Secondary | ICD-10-CM

## 2019-10-17 DIAGNOSIS — M549 Dorsalgia, unspecified: Secondary | ICD-10-CM

## 2019-10-17 DIAGNOSIS — M810 Age-related osteoporosis without current pathological fracture: Secondary | ICD-10-CM

## 2019-10-17 DIAGNOSIS — C801 Malignant (primary) neoplasm, unspecified: Secondary | ICD-10-CM

## 2019-10-17 DIAGNOSIS — K8020 Calculus of gallbladder without cholecystitis without obstruction: Secondary | ICD-10-CM

## 2019-10-17 DIAGNOSIS — I253 Aneurysm of heart: Secondary | ICD-10-CM

## 2019-10-17 DIAGNOSIS — J431 Panlobular emphysema: Secondary | ICD-10-CM

## 2019-10-17 DIAGNOSIS — M797 Fibromyalgia: Secondary | ICD-10-CM

## 2019-10-17 DIAGNOSIS — R51 Headache: Secondary | ICD-10-CM

## 2019-10-17 DIAGNOSIS — E119 Type 2 diabetes mellitus without complications: Secondary | ICD-10-CM

## 2019-10-17 DIAGNOSIS — I00 Rheumatic fever without heart involvement: Secondary | ICD-10-CM

## 2019-10-18 ENCOUNTER — Encounter: Admit: 2019-10-18 | Discharge: 2019-10-18 | Payer: MEDICARE

## 2019-10-18 NOTE — Progress Notes
Spoke to Ms. [place]. She states she is not ready to schedule her valve evaluation and will call when she is ready. Will defer referral at this time.

## 2020-01-26 ENCOUNTER — Encounter: Admit: 2020-01-26 | Discharge: 2020-01-26 | Payer: MEDICARE

## 2020-01-27 NOTE — Progress Notes
Hx: Right breast mass, CHF,dm2, asthma, mitral reurg afib, rvr, on eliquis.     Reason for transfer: NSTEMI and no services available.     Pt has been having cp intermittently since yesterday. 1730 today went to ER for sob and cp rating 5/10     trop .022 (.013 top of normal)--->026, currently pain free.     Bp: 150118--> 200/130-->, 202/156    Meds: 5mg  Hydralazine given.     Labs:  Hgb 12.1  Hct 36.2  Plt 189  Na 139  K 3.6  Chl 104   gap 13  Creat. 0.98    Bun 12   Gluc 255  Bnp 2576 got 40 laxix  Xray: minimal pulm congestion   ST on EKG, LV Hypertrophy, no acute changes.   Duoneb treatment  Covid negative

## 2020-02-03 ENCOUNTER — Encounter: Admit: 2020-02-03 | Discharge: 2020-02-03 | Payer: MEDICARE

## 2020-02-03 DIAGNOSIS — I358 Other nonrheumatic aortic valve disorders: Secondary | ICD-10-CM

## 2020-02-03 DIAGNOSIS — I253 Aneurysm of heart: Secondary | ICD-10-CM

## 2020-02-03 DIAGNOSIS — B999 Unspecified infectious disease: Secondary | ICD-10-CM

## 2020-02-03 DIAGNOSIS — E785 Hyperlipidemia, unspecified: Secondary | ICD-10-CM

## 2020-02-03 DIAGNOSIS — R0600 Dyspnea, unspecified: Secondary | ICD-10-CM

## 2020-02-03 DIAGNOSIS — I272 Pulmonary hypertension, unspecified: Secondary | ICD-10-CM

## 2020-02-03 DIAGNOSIS — E782 Mixed hyperlipidemia: Secondary | ICD-10-CM

## 2020-02-03 DIAGNOSIS — I10 Essential (primary) hypertension: Secondary | ICD-10-CM

## 2020-02-03 DIAGNOSIS — M1990 Unspecified osteoarthritis, unspecified site: Secondary | ICD-10-CM

## 2020-02-03 DIAGNOSIS — R51 Headache: Secondary | ICD-10-CM

## 2020-02-03 DIAGNOSIS — K8020 Calculus of gallbladder without cholecystitis without obstruction: Secondary | ICD-10-CM

## 2020-02-03 DIAGNOSIS — C801 Malignant (primary) neoplasm, unspecified: Secondary | ICD-10-CM

## 2020-02-03 DIAGNOSIS — J431 Panlobular emphysema: Secondary | ICD-10-CM

## 2020-02-03 DIAGNOSIS — E039 Hypothyroidism, unspecified: Secondary | ICD-10-CM

## 2020-02-03 DIAGNOSIS — I00 Rheumatic fever without heart involvement: Secondary | ICD-10-CM

## 2020-02-03 DIAGNOSIS — E119 Type 2 diabetes mellitus without complications: Secondary | ICD-10-CM

## 2020-02-03 DIAGNOSIS — H547 Unspecified visual loss: Secondary | ICD-10-CM

## 2020-02-03 DIAGNOSIS — C50919 Malignant neoplasm of unspecified site of unspecified female breast: Secondary | ICD-10-CM

## 2020-02-03 DIAGNOSIS — I351 Nonrheumatic aortic (valve) insufficiency: Secondary | ICD-10-CM

## 2020-02-03 DIAGNOSIS — M797 Fibromyalgia: Secondary | ICD-10-CM

## 2020-02-03 DIAGNOSIS — M549 Dorsalgia, unspecified: Secondary | ICD-10-CM

## 2020-02-03 DIAGNOSIS — C539 Malignant neoplasm of cervix uteri, unspecified: Secondary | ICD-10-CM

## 2020-02-03 DIAGNOSIS — G459 Transient cerebral ischemic attack, unspecified: Secondary | ICD-10-CM

## 2020-02-03 DIAGNOSIS — M810 Age-related osteoporosis without current pathological fracture: Secondary | ICD-10-CM

## 2020-02-03 NOTE — Patient Instructions
Follow up with Dr. Geronimo Boot in about 3 months    Have echo done same day

## 2020-05-08 ENCOUNTER — Ambulatory Visit: Admit: 2020-05-08 | Discharge: 2020-05-08 | Payer: MEDICARE

## 2020-05-08 ENCOUNTER — Encounter: Admit: 2020-05-08 | Discharge: 2020-05-08 | Payer: MEDICARE

## 2020-05-08 DIAGNOSIS — I10 Essential (primary) hypertension: Secondary | ICD-10-CM

## 2020-05-11 ENCOUNTER — Encounter: Admit: 2020-05-11 | Discharge: 2020-05-11 | Payer: MEDICARE

## 2020-05-16 ENCOUNTER — Encounter: Admit: 2020-05-16 | Discharge: 2020-05-16 | Payer: MEDICARE

## 2020-05-19 ENCOUNTER — Encounter: Admit: 2020-05-19 | Discharge: 2020-05-19 | Payer: MEDICARE

## 2020-05-19 NOTE — Telephone Encounter
-----   Message from Wynona Meals, RN sent at 05/19/2020  9:34 AM CST -----    ----- Message -----  From: Shan Levans, MD  Sent: 05/18/2020   5:53 PM CST  To: Binnie Kand, RN    Lets be sure we have an out pt follow up right away to discuss. She hasn't wanted intervention in the past but we need to be sure she is scheduled for a f/U visit.

## 2020-05-19 NOTE — Telephone Encounter
Results and recommendations called to patient. Patient has no questions at this time. Patient scheduled for f/u 06/08/20 at 10:45am. Patient verified D-T-L of appt.

## 2020-05-25 ENCOUNTER — Encounter: Admit: 2020-05-25 | Discharge: 2020-05-25 | Payer: MEDICARE

## 2020-05-25 MED ORDER — LISINOPRIL 40 MG PO TAB
ORAL_TABLET | Freq: Every day | 0 refills
Start: 2020-05-25 — End: ?

## 2020-05-30 ENCOUNTER — Encounter: Admit: 2020-05-30 | Discharge: 2020-05-30 | Payer: MEDICARE

## 2020-06-08 ENCOUNTER — Encounter: Admit: 2020-06-08 | Discharge: 2020-06-08 | Payer: MEDICARE

## 2020-06-08 DIAGNOSIS — I10 Essential (primary) hypertension: Secondary | ICD-10-CM

## 2020-06-08 DIAGNOSIS — I358 Other nonrheumatic aortic valve disorders: Secondary | ICD-10-CM

## 2020-06-08 DIAGNOSIS — I340 Nonrheumatic mitral (valve) insufficiency: Secondary | ICD-10-CM

## 2020-06-08 DIAGNOSIS — M810 Age-related osteoporosis without current pathological fracture: Secondary | ICD-10-CM

## 2020-06-08 DIAGNOSIS — R0600 Dyspnea, unspecified: Secondary | ICD-10-CM

## 2020-06-08 DIAGNOSIS — R079 Chest pain, unspecified: Secondary | ICD-10-CM

## 2020-06-08 DIAGNOSIS — C50919 Malignant neoplasm of unspecified site of unspecified female breast: Secondary | ICD-10-CM

## 2020-06-08 DIAGNOSIS — C801 Malignant (primary) neoplasm, unspecified: Secondary | ICD-10-CM

## 2020-06-08 DIAGNOSIS — E782 Mixed hyperlipidemia: Secondary | ICD-10-CM

## 2020-06-08 DIAGNOSIS — I253 Aneurysm of heart: Secondary | ICD-10-CM

## 2020-06-08 DIAGNOSIS — E785 Hyperlipidemia, unspecified: Secondary | ICD-10-CM

## 2020-06-08 DIAGNOSIS — M797 Fibromyalgia: Secondary | ICD-10-CM

## 2020-06-08 DIAGNOSIS — R51 Headache: Secondary | ICD-10-CM

## 2020-06-08 DIAGNOSIS — I272 Pulmonary hypertension, unspecified: Secondary | ICD-10-CM

## 2020-06-08 DIAGNOSIS — H547 Unspecified visual loss: Secondary | ICD-10-CM

## 2020-06-08 DIAGNOSIS — C539 Malignant neoplasm of cervix uteri, unspecified: Secondary | ICD-10-CM

## 2020-06-08 DIAGNOSIS — M1990 Unspecified osteoarthritis, unspecified site: Secondary | ICD-10-CM

## 2020-06-08 DIAGNOSIS — J431 Panlobular emphysema: Secondary | ICD-10-CM

## 2020-06-08 DIAGNOSIS — E119 Type 2 diabetes mellitus without complications: Secondary | ICD-10-CM

## 2020-06-08 DIAGNOSIS — I00 Rheumatic fever without heart involvement: Secondary | ICD-10-CM

## 2020-06-08 DIAGNOSIS — G459 Transient cerebral ischemic attack, unspecified: Secondary | ICD-10-CM

## 2020-06-08 DIAGNOSIS — K8020 Calculus of gallbladder without cholecystitis without obstruction: Secondary | ICD-10-CM

## 2020-06-08 DIAGNOSIS — M549 Dorsalgia, unspecified: Secondary | ICD-10-CM

## 2020-06-08 DIAGNOSIS — B999 Unspecified infectious disease: Secondary | ICD-10-CM

## 2020-06-08 DIAGNOSIS — E039 Hypothyroidism, unspecified: Secondary | ICD-10-CM

## 2020-06-08 NOTE — Patient Instructions
Appointment for Mitral Valve Clip    Continue with Bumex

## 2020-06-13 ENCOUNTER — Encounter: Admit: 2020-06-13 | Discharge: 2020-06-13 | Payer: MEDICARE

## 2020-06-13 DIAGNOSIS — G459 Transient cerebral ischemic attack, unspecified: Secondary | ICD-10-CM

## 2020-06-13 DIAGNOSIS — M1990 Unspecified osteoarthritis, unspecified site: Secondary | ICD-10-CM

## 2020-06-13 DIAGNOSIS — B999 Unspecified infectious disease: Secondary | ICD-10-CM

## 2020-06-13 DIAGNOSIS — R51 Headache: Secondary | ICD-10-CM

## 2020-06-13 DIAGNOSIS — M549 Dorsalgia, unspecified: Secondary | ICD-10-CM

## 2020-06-13 DIAGNOSIS — I00 Rheumatic fever without heart involvement: Secondary | ICD-10-CM

## 2020-06-13 DIAGNOSIS — C801 Malignant (primary) neoplasm, unspecified: Secondary | ICD-10-CM

## 2020-06-13 DIAGNOSIS — J431 Panlobular emphysema: Secondary | ICD-10-CM

## 2020-06-13 DIAGNOSIS — K8020 Calculus of gallbladder without cholecystitis without obstruction: Secondary | ICD-10-CM

## 2020-06-13 DIAGNOSIS — I340 Nonrheumatic mitral (valve) insufficiency: Secondary | ICD-10-CM

## 2020-06-13 DIAGNOSIS — C50919 Malignant neoplasm of unspecified site of unspecified female breast: Secondary | ICD-10-CM

## 2020-06-13 DIAGNOSIS — M810 Age-related osteoporosis without current pathological fracture: Secondary | ICD-10-CM

## 2020-06-13 DIAGNOSIS — E039 Hypothyroidism, unspecified: Secondary | ICD-10-CM

## 2020-06-13 DIAGNOSIS — I358 Other nonrheumatic aortic valve disorders: Secondary | ICD-10-CM

## 2020-06-13 DIAGNOSIS — E119 Type 2 diabetes mellitus without complications: Secondary | ICD-10-CM

## 2020-06-13 DIAGNOSIS — E785 Hyperlipidemia, unspecified: Secondary | ICD-10-CM

## 2020-06-13 DIAGNOSIS — H547 Unspecified visual loss: Secondary | ICD-10-CM

## 2020-06-13 DIAGNOSIS — R0600 Dyspnea, unspecified: Secondary | ICD-10-CM

## 2020-06-13 DIAGNOSIS — C539 Malignant neoplasm of cervix uteri, unspecified: Secondary | ICD-10-CM

## 2020-06-13 DIAGNOSIS — M797 Fibromyalgia: Secondary | ICD-10-CM

## 2020-06-13 DIAGNOSIS — I10 Essential (primary) hypertension: Secondary | ICD-10-CM

## 2020-06-13 DIAGNOSIS — I253 Aneurysm of heart: Secondary | ICD-10-CM

## 2020-06-15 ENCOUNTER — Encounter: Admit: 2020-06-15 | Discharge: 2020-06-15 | Payer: MEDICARE

## 2020-06-15 MED ORDER — LISINOPRIL 40 MG PO TAB
ORAL_TABLET | Freq: Every day | 3 refills | Status: AC
Start: 2020-06-15 — End: ?

## 2020-07-06 ENCOUNTER — Encounter: Admit: 2020-07-06 | Discharge: 2020-07-06 | Payer: MEDICARE

## 2020-07-10 ENCOUNTER — Ambulatory Visit: Admit: 2020-07-10 | Discharge: 2020-07-10 | Payer: MEDICARE

## 2020-07-10 ENCOUNTER — Encounter: Admit: 2020-07-10 | Discharge: 2020-07-10 | Payer: MEDICARE

## 2020-07-10 DIAGNOSIS — B999 Unspecified infectious disease: Secondary | ICD-10-CM

## 2020-07-10 DIAGNOSIS — J431 Panlobular emphysema: Secondary | ICD-10-CM

## 2020-07-10 DIAGNOSIS — I340 Nonrheumatic mitral (valve) insufficiency: Secondary | ICD-10-CM

## 2020-07-10 DIAGNOSIS — M797 Fibromyalgia: Secondary | ICD-10-CM

## 2020-07-10 DIAGNOSIS — I10 Essential (primary) hypertension: Secondary | ICD-10-CM

## 2020-07-10 DIAGNOSIS — R51 Headache: Secondary | ICD-10-CM

## 2020-07-10 DIAGNOSIS — I253 Aneurysm of heart: Secondary | ICD-10-CM

## 2020-07-10 DIAGNOSIS — M549 Dorsalgia, unspecified: Secondary | ICD-10-CM

## 2020-07-10 DIAGNOSIS — K8020 Calculus of gallbladder without cholecystitis without obstruction: Secondary | ICD-10-CM

## 2020-07-10 DIAGNOSIS — M1990 Unspecified osteoarthritis, unspecified site: Secondary | ICD-10-CM

## 2020-07-10 DIAGNOSIS — I00 Rheumatic fever without heart involvement: Secondary | ICD-10-CM

## 2020-07-10 DIAGNOSIS — M810 Age-related osteoporosis without current pathological fracture: Secondary | ICD-10-CM

## 2020-07-10 DIAGNOSIS — E039 Hypothyroidism, unspecified: Secondary | ICD-10-CM

## 2020-07-10 DIAGNOSIS — C539 Malignant neoplasm of cervix uteri, unspecified: Secondary | ICD-10-CM

## 2020-07-10 DIAGNOSIS — I071 Rheumatic tricuspid insufficiency: Secondary | ICD-10-CM

## 2020-07-10 DIAGNOSIS — H547 Unspecified visual loss: Secondary | ICD-10-CM

## 2020-07-10 DIAGNOSIS — G459 Transient cerebral ischemic attack, unspecified: Secondary | ICD-10-CM

## 2020-07-10 DIAGNOSIS — I5022 Chronic systolic (congestive) heart failure: Secondary | ICD-10-CM

## 2020-07-10 DIAGNOSIS — I351 Nonrheumatic aortic (valve) insufficiency: Secondary | ICD-10-CM

## 2020-07-10 DIAGNOSIS — R0600 Dyspnea, unspecified: Secondary | ICD-10-CM

## 2020-07-10 DIAGNOSIS — C50919 Malignant neoplasm of unspecified site of unspecified female breast: Secondary | ICD-10-CM

## 2020-07-10 DIAGNOSIS — E782 Mixed hyperlipidemia: Secondary | ICD-10-CM

## 2020-07-10 DIAGNOSIS — E119 Type 2 diabetes mellitus without complications: Secondary | ICD-10-CM

## 2020-07-10 DIAGNOSIS — E785 Hyperlipidemia, unspecified: Secondary | ICD-10-CM

## 2020-07-10 DIAGNOSIS — C801 Malignant (primary) neoplasm, unspecified: Secondary | ICD-10-CM

## 2020-07-10 DIAGNOSIS — I358 Other nonrheumatic aortic valve disorders: Secondary | ICD-10-CM

## 2020-07-10 DIAGNOSIS — I272 Pulmonary hypertension, unspecified: Secondary | ICD-10-CM

## 2020-07-10 DIAGNOSIS — R079 Chest pain, unspecified: Secondary | ICD-10-CM

## 2020-07-10 LAB — BASIC METABOLIC PANEL
Lab: 1.1 mg/dL — ABNORMAL HIGH (ref 0.4–1.00)
Lab: 11 (ref 3–12)
Lab: 144 MMOL/L (ref 137–147)
Lab: 184 mg/dL — ABNORMAL HIGH (ref 70–100)
Lab: 23 mg/dL (ref 7–25)
Lab: 3.3 MMOL/L — ABNORMAL LOW (ref 3.5–5.1)
Lab: 35 MMOL/L — ABNORMAL HIGH (ref 21–30)
Lab: 47 mL/min — ABNORMAL LOW (ref >60)
Lab: 9.7 mg/dL (ref 8.5–10.6)
Lab: 98 MMOL/L (ref 98–110)

## 2020-07-10 MED ORDER — DIPHENHYDRAMINE HCL 50 MG/ML IJ SOLN
25 mg | Freq: Once | INTRAVENOUS | 0 refills | PRN
Start: 2020-07-10 — End: ?

## 2020-07-10 MED ORDER — ALBUTEROL SULFATE 90 MCG/ACTUATION IN HFAA
2 | Freq: Once | RESPIRATORY_TRACT | 0 refills | Status: CP
Start: 2020-07-10 — End: ?
  Administered 2020-07-10: 16:00:00 2 via RESPIRATORY_TRACT

## 2020-07-10 MED ORDER — GLYCOPYRROLATE 0.2 MG/ML IJ SOLN
INTRAVENOUS | 0 refills | Status: DC
Start: 2020-07-10 — End: 2020-07-10
  Administered 2020-07-10: 17:00:00 .2 mg via INTRAVENOUS
  Administered 2020-07-10: 17:00:00 .4 mg via INTRAVENOUS
  Administered 2020-07-10: 17:00:00 .2 mg via INTRAVENOUS

## 2020-07-10 MED ORDER — EPINEPHRINE 0.1MG NS 10ML SYR (AM)(OR)
INTRAVENOUS | 0 refills | Status: DC
Start: 2020-07-10 — End: 2020-07-10
  Administered 2020-07-10 (×2): 10 ug via INTRAVENOUS

## 2020-07-10 MED ORDER — OXYCODONE 5 MG PO TAB
5-10 mg | Freq: Once | ORAL | 0 refills | PRN
Start: 2020-07-10 — End: ?

## 2020-07-10 MED ORDER — HALOPERIDOL LACTATE 5 MG/ML IJ SOLN
1 mg | Freq: Once | INTRAVENOUS | 0 refills | PRN
Start: 2020-07-10 — End: ?

## 2020-07-10 MED ORDER — EPHEDRINE SULFATE 50 MG/ML IV SOLN
INTRAVENOUS | 0 refills | Status: DC
Start: 2020-07-10 — End: 2020-07-10
  Administered 2020-07-10: 17:00:00 5 mg via INTRAVENOUS
  Administered 2020-07-10 (×2): 10 mg via INTRAVENOUS
  Administered 2020-07-10: 16:00:00 5 mg via INTRAVENOUS
  Administered 2020-07-10 (×2): 10 mg via INTRAVENOUS

## 2020-07-10 MED ORDER — PROPOFOL INJ 10 MG/ML IV VIAL
INTRAVENOUS | 0 refills | Status: DC
Start: 2020-07-10 — End: 2020-07-10
  Administered 2020-07-10: 16:00:00 20 mg via INTRAVENOUS
  Administered 2020-07-10 (×3): 10 mg via INTRAVENOUS

## 2020-07-10 MED ORDER — FENTANYL CITRATE (PF) 50 MCG/ML IJ SOLN
50-100 ug | INTRAVENOUS | 0 refills | PRN
Start: 2020-07-10 — End: ?

## 2020-07-10 MED ORDER — HYDROMORPHONE (PF) 2 MG/ML IJ SYRG
.5-1 mg | INTRAVENOUS | 0 refills | PRN
Start: 2020-07-10 — End: ?

## 2020-07-10 MED ORDER — LIDOCAINE 5 % TP OINT
Freq: Once | TOPICAL | 0 refills | Status: CP
Start: 2020-07-10 — End: ?
  Administered 2020-07-10: 16:00:00 via TOPICAL

## 2020-07-10 MED ORDER — PHENYLEPHRINE HCL IN 0.9% NACL 1 MG/10 ML (100 MCG/ML) IV SYRG
INTRAVENOUS | 0 refills | Status: DC
Start: 2020-07-10 — End: 2020-07-10
  Administered 2020-07-10: 17:00:00 100 ug via INTRAVENOUS

## 2020-07-10 MED ORDER — SODIUM CHLORIDE 0.9 % IV SOLP (OR) 500ML
INTRAVENOUS | 0 refills | Status: DC
Start: 2020-07-10 — End: 2020-07-10
  Administered 2020-07-10: 16:00:00 via INTRAVENOUS

## 2020-07-10 MED ORDER — PROPOFOL 10 MG/ML IV EMUL 20 ML (INFUSION)(AM)(OR)
INTRAVENOUS | 0 refills | Status: DC
Start: 2020-07-10 — End: 2020-07-10
  Administered 2020-07-10: 16:00:00 80 ug/kg/min via INTRAVENOUS

## 2020-07-10 MED ORDER — BUMETANIDE 1 MG PO TAB
ORAL_TABLET | Freq: Every day | 1 refills | Status: AC
Start: 2020-07-10 — End: ?

## 2020-07-10 MED ORDER — LIDOCAINE (PF) 20 MG/ML (2 %) IJ SOLN
INTRAVENOUS | 0 refills | Status: DC
Start: 2020-07-10 — End: 2020-07-10
  Administered 2020-07-10: 16:00:00 100 mg via INTRAVENOUS

## 2020-07-10 NOTE — Anesthesia Pre-Procedure Evaluation
Anesthesia Pre-Procedure Evaluation    Name: Kimberly [place]      MRN: [id number]     DOB: 06-07-1940     Age: 80 y.o.     Sex: female   _________________________________________________________________________     Procedure Info:   Procedure Information     Date/Time: 07/10/20 1000    Scheduled providers: Winfred Burn, MD    Procedure: TRANSESOPHAGEAL ECHO    [facility]          Physical Assessment  Vital Signs (last filed in past 24 hours):  BP: 159/102 (02/28 0823)  Pulse: 74 (02/28 0819)  SpO2: 96 % (02/28 0819)  Height: 165.1 cm (5' 5) (02/28 0902)  Weight: 69.9 kg (154 lb) (02/28 0902)      Patient History   Allergies   Allergen Reactions   ? Diltiazem EDEMA   ? Metronidazole ITCHING and SEE COMMENTS     Low heart rate   ? Methylprednisone UNKNOWN        Current Medications    Medication Directions   albuterol sulfate (PROAIR HFA) 90 mcg/actuation HFA aerosol inhaler Inhale two puffs by mouth into the lungs every 6 hours as needed for Wheezing.   bumetanide (BUMEX) 1 mg tablet Take 1 tablet by mouth twice daily.   carvedilol (COREG) 25 mg tablet Take 1 Tab by mouth twice daily.   ELIQUIS 5 mg tablet Take 1 tablet by mouth twice daily.   glimepiride (AMARYL) 2 mg tablet Take 1 tablet by mouth daily.   ibuprofen (ADVIL) 200 mg PO tablet Take 200 mg by mouth every 6 hours as needed.   levothyroxine (SYNTHROID) 200 mcg tablet Take 1 tablet by mouth daily.   lisinopriL (ZESTRIL) 40 mg tablet TAKE ONE TABLET BY MOUTH EVERY DAY   metFORMIN-XR (GLUCOPHAGE XR) 500 mg extended release tablet Take 500 mg by mouth daily.   metOLazone (ZAROXOLYN) 2.5 mg tablet Take 1 tablet by mouth every 48 hours.   potassium chloride (K-TAB) 20 mEq tablet Take 20 mEq by mouth daily.   spironolactone (ALDACTONE) 25 mg tablet Take one tablet by mouth daily. Take with food.   traMADoL (ULTRAM) 50 mg tablet Take 1 tablet by mouth as Needed.         Review of Systems/Medical History        PONV Screening: Female gender, Non-smoker and Postoperative opioids      Airway - negative        Pulmonary         COPD      Shortness of breath      Cardiovascular         Hypertension,         Valvular problems/murmurs: MR      CHF      Hyperlipidemia      Orthopnea      Dyspnea on exertion      GI/Hepatic/Renal         No GERD,       No hx of liver disease     No renal disease        Musculoskeletal         Back pain      Arthritis      Endocrine/Other       Diabetes      Hypothyroidism   Physical Exam    Airway Findings      Mallampati: III      TM distance: >3 FB  Neck ROM: full      Mouth opening: good    Dental Findings:       Upper dentures and lower dentures    Cardiovascular Findings:       Rhythm: regular      Rate: normal      Other findings: murmur    Pulmonary Findings:       Breath sounds clear to auscultation.    Abdominal Findings:       Not obese    Neurological Findings:       Alert and oriented x 3    Constitutional findings:       No acute distress       Diagnostic Tests  Hematology:   Lab Results   Component Value Date    HGB 10.8 05/24/2020    HCT 34.1 05/24/2020    PLTCT 189 05/24/2020    WBC 4.4 05/24/2020    NEUT 58 02/12/2018    ANC 2.50 02/12/2018    ALC 1.10 02/12/2018    MONA 9 02/12/2018    AMC 0.40 02/12/2018    EOSA 5 02/12/2018    ABC 0.00 02/12/2018    MCV 88.0 05/24/2020    MCH 28.0 05/24/2020    MCHC 31.8 05/24/2020    MPV 8.6 02/12/2018    RDW 14.6 05/24/2020         General Chemistry:   Lab Results   Component Value Date    NA 141 05/29/2020    K 3.1 05/29/2020    CL 99 05/29/2020    CO2 32.0 05/29/2020    GAP 13 05/29/2020    BUN 12.0 05/29/2020    CR 1.00 05/29/2020    GLU 134 05/29/2020    CA 9.3 05/29/2020    ALBUMIN 3.4 05/06/2020    MG 1.6 07/27/2006    TOTBILI 0.67 05/06/2020    PO4 3.0 07/27/2006      Coagulation:   Lab Results   Component Value Date    PT 12.1 07/27/2006    PTT 21.4 07/27/2006    INR 1.1 07/27/2006       Echo 04/2020  Technically difficult study. Echo contrast was given to enhance endocardial definition.   ?  1. The left ventricle is dilated and spherical in shape.  Moderate concentric hypertrophy.  Severely reduced systolic function. Estimated EF 25-30%.   2. Normal right ventricular size.  Mildly reduced systolic function.  3. Biatrial dilatation.  4. The aortic valve is heavily calcified.  At least mild stenosis.  Likely low-flow low gradient (SVI 25 mL/m?).  Moderate regurgitation.  5. Moderate calcification of the mitral valve annulus.  No stenosis.  Severe regurgitation.  6. 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  7. Trivial pericardial effusion.  8. Estimated pulmonary artery systolic pressure 72 mmHg.   9. 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  ?  Comparison is made with a prior transthoracic echocardiogram performed 07/20/2019.  There has been interval reduction in left ventricular systolic function.  Mitral and tricuspid valve regurgitation also appears to have worsened.  Pulmonary artery systolic pressure was previously estimated at 64 mmHg.  ?      Anesthesia Plan    ASA score: 4   Plan: MAC  Induction method: intravenous  NPO status: acceptable      Informed Consent  Anesthetic plan and risks discussed with patient.  Use of blood products discussed with patient  Blood Consent: consented  Plan discussed with: anesthesiologist and CRNA.

## 2020-07-10 NOTE — Progress Notes
Care Plan   Care Category & Patient Outcome Goal Met Treatment/  Interventions Plan of the Day RN Name   Cardiovascular  Hemodynamic stability and adequate peripheral perfusion.   Yes   Refer to  patient's chart. Monitor VS per sedation standard. Monitor ECG continuously.  Assess/maintain IV patency.   Adrik Khim, RN   Respiratory  Patent airway, ease of respiration, and adequate oxygenation.   Yes   Refer to  patient's chart. Maintain open airway. Assess respirations. Monitor O2 saturations. Titrate O2 to keep sat>= 95% or baseline.   Latoy Labriola, RN   Psychological/Emotional/Spiritual  Cope with procedure with support in place.  Spiritual needs are addressed.     Yes   Refer to  patient's chart. Provide adequate and thorough instructions.  Provide a caring and supportive environment.  Communicate patients concerns with other members of the health team.   Santana Edell, RN   Pain  Patients pain goal met.   Yes   Refer to  patient's chart. Prepare patient for potentially uncomfortable procedure.  Observe for verbal/nonverbal complaints of pain.  Assess pain.  Provide comfort measures.   Brina Umeda, RN   Safety/Fall Risk  Free from injury, security maintained.   Yes High Risk    Refer to  patient's chart.   Follow nursing standard of practice for high risks fall patients.   Kishawn Pickar, RN   Knowledge Base  Verbalize understanding of procedure/information provided.   Yes   Refer to  patient's chart. Describe the procedure along with what symptoms to expect.  Evaluate patients understanding of procedure.  Encourage patient to ask questions.  Provide additional information as needed.   Leodan Bolyard, RN

## 2020-07-10 NOTE — Patient Instructions
You will proceed with a TEE to further look at the valves.      We will see you in the clinic later in the afternoon at 1:00 pm.

## 2020-07-10 NOTE — Patient Instructions
We will have labs drawn before you leave today and please have them drawn again in 1 week (on or about 3/7)    INCREASE your  Morning Bumex to 2 mg daily and continue to take 1 mg in the afternoon.    Respectfully,  [facility] Nurse Coordinator  [facility]. [phone]  Fax [phone]

## 2020-07-10 NOTE — Progress Notes
BMP order faxed to [facility] at fax# [phone].

## 2020-07-10 NOTE — Patient Instructions
CARDIOLOGY PROCEDURES           POST SEDATION INSTRUCTIONS      Patient Name: Kimberly Mitchell  MRN#: [id number]  Date: 07/10/2020      Please follow the instructions listed below:    1. Assess gag reflex at  11:30  (time).    ? Avoid food and beverages (especially hot liquids) until sensation has returned to your throat.    2. If gag reflex has not returned; wait 20-30 minutes, check reflex again.     3. Resume diet as prescribed when gag reflex is present.  This typically occurs within one hour.    ? The following day you may experience a minor sore throat.    4. Please have someone accompany you, as YOU SHOULD NOT drive or operate machinery for at least 12-24 hours following the procedure.    ? There may be some residual effects from the sedatives during the procedure.  ? Do not drive a vehicle for up to 24 hours after receiving sedation.  ? Do not operate heavy or potentially harmful equipment  ? Do not make legally binding decisions  ? Do not drink alcohol for up to 24 hours  ? Do not communicate through social media for 24 hours.    5. Other instructions: resume normal diet and medications as prescribed    6. If you have question or concerns about this procedure, please contact the Cardiology office at [phone], and ask to speak to one of the nurses.      Current Medications List:  ? albuterol sulfate (PROAIR HFA) 90 mcg/actuation HFA aerosol inhaler Inhale two puffs by mouth into the lungs every 6 hours as needed for Wheezing.   ? bumetanide (BUMEX) 1 mg tablet Take 1 tablet by mouth twice daily.   ? carvedilol (COREG) 25 mg tablet Take 1 Tab by mouth twice daily.   ? ELIQUIS 5 mg tablet Take 1 tablet by mouth twice daily.   ? glimepiride (AMARYL) 2 mg tablet Take 1 tablet by mouth daily.   ? ibuprofen (ADVIL) 200 mg PO tablet Take 200 mg by mouth every 6 hours as needed.   ? levothyroxine (SYNTHROID) 200 mcg tablet Take 1 tablet by mouth daily.   ? lisinopriL (ZESTRIL) 40 mg tablet TAKE ONE TABLET BY MOUTH EVERY DAY   ? metFORMIN-XR (GLUCOPHAGE XR) 500 mg extended release tablet Take 500 mg by mouth daily.   ? metOLazone (ZAROXOLYN) 2.5 mg tablet Take 1 tablet by mouth every 48 hours.   ? potassium chloride (K-TAB) 20 mEq tablet Take 20 mEq by mouth daily.   ? spironolactone (ALDACTONE) 25 mg tablet Take one tablet by mouth daily. Take with food.   ? traMADoL (ULTRAM) 50 mg tablet Take 1 tablet by mouth as Needed.         Instructions Given To: patient and spouse    Instructions Given By: Patricia Pesa, RN

## 2020-07-10 NOTE — Anesthesia Post-Procedure Evaluation
Post-Anesthesia Evaluation    Name: Kimberly Mitchell      MRN: [id number]     DOB: 06-27-1940     Age: 80 y.o.     Sex: female   __________________________________________________________________________     Procedure Information     Anesthesia Start Date/Time: 07/10/20 0948    Scheduled providers: Maurice Small, MD    Procedure: TRANSESOPHAGEAL ECHO    Location: Cardiology:[facility]          Post-Anesthesia Vitals  BP: 135/71 (02/28 1130)  Pulse: 72 (02/28 1130)  Respirations: 19 PER MINUTE (02/28 1130)  SpO2: 94 % (02/28 1130)  SpO2 Pulse: 72 (02/28 1130)  Height: 165.1 cm (5\' 5" ) (02/28 1100) No vitals data found for the desired time range.        Post Anesthesia Evaluation Note    Evaluation location: Pre/Post  Patient participation: recovered; patient participated in evaluation  Level of consciousness: alert    Pain score: 0  Pain management: adequate    Hydration: normovolemia  Temperature: 36.0C - 38.4C  Airway patency: adequate    Perioperative Events       Post-op nausea and vomiting: no PONV    Postoperative Status  Cardiovascular status: hemodynamically stable  Respiratory status: spontaneous ventilation  Follow-up needed: none        Perioperative Events  Encounter Complications   Complication Outcome Phase Comment   Hypotension Resolved in Room Intraprocedure

## 2020-07-10 NOTE — Progress Notes
Pre-Operative Assessment for TEE or Cardioversion    Date of Service:  07/10/2020    Kimberly Mitchell is a 80 y.o. y.o. female. With significant past medical history of breast cancer post mastectomy, hypothyroidism, hypertension, diabetes, hyperlipidemia, aortic stenosis, fibromyalgia, cervical cancer, arthritis, osteoporosis, TIA, COPD, rheumatic fever, mitral valve regurgitation, tricuspid regurgitation and CHF, who is referred for TEE Indication: mitral regurgitation, MitraClip evaluation .      she has been compliant with her  apixaban (Eliquis) Missed dose: Noand ... bleeding. she is Positive for: GERD, Loose/False teeth, Hx. Head/Neck surgery/radiation and Hx. Chest surgery/radiation and Positive for: COPD / Asthma and Pulm HTN.      GI procedures:none            When: No    Chest pain:  No   SOB: with activity         Medical History:  Medical History:   Diagnosis Date   ? Aortic valve sclerosis    ? Arthritis    ? Atrial septal aneurysm    ? Back pain    ? Breast cancer (HCC)    ? Cancer of cervix (HCC)    ? Chronic systolic heart failure (HCC) 07/10/2020   ? Diabetes (HCC)    ? Dyspnea    ? Fibromyalgia    ? Gallstones    ? Headache(784.0)    ? Hyperlipidemia    ? Hypertension    ? Hypothyroidism    ? Infection    ? Mitral regurgitation    ? Osteoporosis    ? Other malignant neoplasm without specification of site [facility])    ? 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 Rheumatic fever     as a child    ? TIA (transient ischemic attack)    ? Vision decreased         Surgical History:   Surgical History:   Procedure Laterality Date   ? COLONOSCOPY     ? HX CHOLECYSTECTOMY     ? HX HEART CATHETERIZATION     ? HX HYSTERECTOMY     ? MASTECTOMY Right        Social History     Social History     Tobacco Use   ? Smoking status: Never Smoker   ? Smokeless tobacco: Never Used   Substance Use Topics   ? Alcohol use: No   ? Drug use: No         Allergies                                        Allergies   Allergen Reactions ? Diltiazem EDEMA   ? Metronidazole ITCHING and SEE COMMENTS     Low heart rate   ? Methylprednisone UNKNOWN          Current Medications  Current Outpatient Medications on File Prior to Encounter   Medication Sig Dispense Refill   ? albuterol sulfate (PROAIR HFA) 90 mcg/actuation HFA aerosol inhaler Inhale two puffs by mouth into the lungs every 6 hours as needed for Wheezing. 2 g 0   ? bumetanide (BUMEX) 1 mg tablet Take 1 tablet by mouth twice daily.     ? carvedilol (COREG) 25 mg tablet Take 1 Tab by mouth twice daily. 180 Tab 3   ? ELIQUIS 5 mg tablet Take 1 tablet by mouth twice daily.     ?  glimepiride (AMARYL) 2 mg tablet Take 1 tablet by mouth daily.     ? ibuprofen (ADVIL) 200 mg PO tablet Take 200 mg by mouth every 6 hours as needed.     ? levothyroxine (SYNTHROID) 200 mcg tablet Take 1 tablet by mouth daily.     ? lisinopriL (ZESTRIL) 40 mg tablet TAKE ONE TABLET BY MOUTH EVERY DAY 90 tablet 3   ? metFORMIN-XR (GLUCOPHAGE XR) 500 mg extended release tablet Take 500 mg by mouth daily.     ? metOLazone (ZAROXOLYN) 2.5 mg tablet Take 1 tablet by mouth every 48 hours.     ? potassium chloride (K-TAB) 20 mEq tablet Take 20 mEq by mouth daily.     ? spironolactone (ALDACTONE) 25 mg tablet Take one tablet by mouth daily. Take with food. 90 tablet 3   ? traMADoL (ULTRAM) 50 mg tablet Take 1 tablet by mouth as Needed.       No current facility-administered medications on file prior to encounter.       Vitals  Estimated body mass index is 25.63 kg/m? as calculated from the following:    Height as of this encounter: 165.1 cm (5' 5).    Weight as of this encounter: 69.9 kg (154 lb).       Patient appears alert and oriented: Yes  NPO: for greater than 8 hours  Inpatient IV status: None    Diagnostic Tests  White Blood Cells   Date Value Ref Range Status   05/24/2020 4.4 (L) 4.8 - 10.8 Final     Hemoglobin   Date Value Ref Range Status   05/24/2020 10.8 (L) 12.0 - 16.0 Final     Hematocrit   Date Value Ref Range Status   05/24/2020 34.1 (L) 37.0 - 47.0 Final     Platelet Count   Date Value Ref Range Status   05/24/2020 189  Final     Sodium   Date Value Ref Range Status   05/29/2020 141  Final     Potassium   Date Value Ref Range Status   05/29/2020 3.1 (L) 3.5 - 5.1 Final     Magnesium   Date Value Ref Range Status   07/27/2006 1.6 1.6 - 2.6 MG/DL Final     Blood Urea Nitrogen   Date Value Ref Range Status   05/29/2020 12.0  Final     Creatinine   Date Value Ref Range Status   05/29/2020 1.00  Final     Glucose   Date Value Ref Range Status   05/29/2020 134 (H) 70 - 105 Final       Last MAC INR Flow Sheet Entry:    Last recorded Lab results:   INR   Date Value Ref Range Status   07/27/2006 1.1  Final     APTT   Date Value Ref Range Status   07/27/2006 21.4 (L) 22.5 - 30.7 SEC Final           Blood Cultures  Resulted Micro Last 72 Hrs    No results found         Last TEE date: None  Last Cardioversion date: None  Echo procedures within the past 30 days:  No results found.      Device Information on File  No results found for: GENERATOR, EPDEVTYP      Additional Comments:  None    Plan:  Dr. Christain Sacramento will plan to proceed with the  TEE.

## 2020-07-10 NOTE — Progress Notes
Date of Service: 07/10/2020    Kimberly Mitchell is a 80 y.o. female.       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  Other history includes history of breast cancer, diabetes mellitus, hypothyroidism, peripheral neuropathy, atrial fibrillation, and pulmonary emboli.  She underwent recent echo Doppler in December 2021 which revealed a worsening of her ejection fraction down to 25 to 30%, moderate concentric LVH.  She had a heavily calcified aortic valve with low gradient and moderate regurgitation.  She was also noted to have severe mitral regurgitation with mitral annular calcification and severe tricuspid insufficiency with a pulmonary artery systolic pressure of 72 mmHg.  She was referred to the valve clinic for further management of her valvular disease.  She presents today for transesophageal echocardiogram and office visit in the valve clinic.    She reports a progressive decline over the last 9 months and notes progressive shortness of breath mostly with stairs and incline.  She reports she was evaluated in the emergency room at [facility] early last week and was given IV diuretics however was not admitted.  She notes progressive lower extremity edema, weight gain, with associated dyspnea.  She also notes increasing fatigue.  She denies orthopnea or PND.  She does have intermittent episodes of chest pain which are typically short-lived and not related to exertion.  She denies palpitations but does note occasional dizziness, lightheadedness which appears to be worse with position changes.         Vitals:    07/10/20 0819 07/10/20 0823   BP: (!) 179/97 (!) 159/102   BP Source: Arm, Left Upper Arm, Right Lower   Patient Position: Sitting Sitting   Pulse: 74    SpO2: 96%    Weight: 70.2 kg (154 lb 12.8 oz)    Height: 165.1 cm (5' 5)    PainSc: Seven      Body mass index is 25.76 kg/m?Marland Kitchen     Past Medical History  Patient Active Problem List    Diagnosis Date Noted   ? Chronic systolic heart failure (HCC) 07/10/2020   ? Mitral regurgitation    ? Dyspnea      a. 07/2019 hospitalization for worsening  SOA @ [facility]      ? Pulmonary hypertension (HCC)      a. 07/20/2019 - Echo - EF 55% / PAP 64 mmHg / moderate aortic stenosis, moderate aortic insufficiency/ mitral annular calcificaiton and moderate mitral regurgitation @ [place]     ? 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 Use of letrozole (Femara) 07/14/2017   ? 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 History of breast cancer 02/10/2014   ? Chest pain 11/26/2012   ? Carotid bruit 11/26/2012   ? 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 Aortic insufficiency 09/25/2008     Mild to moderate     ? Fibromyalgia 09/25/2008     Follows with a rheumatologist in [facility] (based out of [facility])     ? Atrial septal aneurysm 09/25/2008   ? Hypothyroidism 09/25/2008   ? Hypertension 07/27/2006     02/06 renal ultrasound: Normal segmental arterial wave forms in both kidneys. No signs of renal  artery stenosis.     ? Diabetes mellitus (HCC) 07/27/2006   ? Hyperlipidemia 07/27/2006         Review of Systems   Constitutional: Negative.   HENT: Negative.    Eyes: Negative.    Cardiovascular: Negative.    Respiratory: Negative.    Endocrine: Negative.    Hematologic/Lymphatic: Negative.    Skin: Negative.    Musculoskeletal: Positive for joint pain.   Gastrointestinal: Negative.    Genitourinary: Negative.    Neurological: Negative.    Psychiatric/Behavioral: Negative.    Allergic/Immunologic: Negative.        Physical Exam  General Appearance: no acute distress  Skin: warm & intact  HEENT: unremarkable  Neck Veins: neck veins are flat & not distended  Carotid Arteries: no bruits  Chest Inspection: chest is normal in appearance  Auscultation/Percussion: lungs clear to auscultation, diminished in bases  Cardiac Rhythm: regular rhythm & normal rate  Cardiac Auscultation: Normal S1 & S2, no S3 or S4, no rub  Murmurs:3/6 holosystolic murmur  Extremities: 1+ lower extremity edema  Abdominal Exam: soft, non-tender, no masses, bowel sounds normal  Liver & Spleen: no organomegaly  Neurologic Exam: oriented to time, place and person; no focal neurologic deficits  Psychiatric: Normal mood and affect.  Behavior is normal. Judgment and thought content normal.         Cardiovascular Studies      Cardiovascular Health Factors  Vitals BP Readings from Last 3 Encounters:   07/10/20 (!) 159/102   06/08/20 (!) 168/98   05/08/20 (!) 161/101     Wt Readings from Last 3 Encounters:   07/10/20 70.2 kg (154 lb 12.8 oz)   07/10/20 69.9 kg (154 lb)   06/08/20 65 kg (143 lb 3.2 oz)     BMI Readings from Last 3 Encounters:   07/10/20 25.76 kg/m?   07/10/20 25.63 kg/m?   06/08/20 23.83 kg/m?      Smoking Social History     Tobacco Use   Smoking Status Never Smoker   Smokeless Tobacco Never Used      Lipid Profile Cholesterol   Date Value Ref Range Status   09/15/2018 215 (H) <200 Final     HDL   Date Value Ref Range Status   09/15/2018 70  Final     LDL   Date Value Ref Range Status   09/15/2018 127 (H) <100 Final     Triglycerides   Date Value Ref Range Status   09/15/2018 90  Final      Blood Sugar Hemoglobin A1C   Date Value Ref Range Status   07/27/2006 7.8 (H) 5.0 - 6.5 % Final     Comment:     For non-diabetic patients, the normal reference range is 5.0-6.5%.  For patients with Type I or Type II Diabetes Mellitus, the ADA recommends   maintaining the A1c level <7.3%.  However, at levels below 5.8%, the risk of   hypoglycemia increases.  Patients with an A1c of >9.8% are considered to be   extremely hyperglycemic.  Please note the reference range is higher by 0.3%.  This is due to the new world   standardizing organizations making diabetic monitoring the same   internationally.     Glucose   Date Value Ref Range Status   05/29/2020 134 (H) 70 - 105 Final   05/06/2020 211 (H) 70 - 105 Final   01/26/2020 255 (H) 70 - 105 Final     Glucose, POC  Date Value Ref Range Status   07/27/2006 192 (H) 70 - 110 MG/DL Final   16/02/9603 540 (H) 70 - 110 MG/DL Final   98/03/9146 829 (H) 70 - 110 MG/DL Final          Problems Addressed Today  Encounter Diagnoses   Name Primary?   ? Mitral valve insufficiency, unspecified etiology Yes   ? Aortic valve insufficiency, etiology of cardiac valve disease unspecified    ? Mixed hyperlipidemia    ? Primary hypertension    ? Chronic systolic heart failure (HCC)        Assessment and Plan     Valvular disease  Mitral regurgitation  Aortic regurgitation  Tricuspid regurgitation  We will plan to proceed with TEE to further define her valvular anatomy.  The procedure as well as risks and benefits were explained to her in detail and she is willing to proceed as directed.  She will meet with Dr. Elias Else and Dr. Mackey Birchwood later today in the valve clinic.    Chronic combined systolic and diastolic heart failure  She has had recent worsening of LV function her EF is down to 25 to 30%.  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  She does have evidence of mild volume overload today on exam.  She describes NYHA class III heart failure symptoms.    Mixed hyperlipidemia    Primary hypertension  Her blood pressure has been difficult to control.  She is moderately elevated today in clinic however reports she is not taken her a.m. medications.    Pulmonary hypertension    I appreciate the opportunity to participate in her care.    Dorena Cookey, APRN  Pager [phone]               Current Medications (including today's revisions)  ? albuterol sulfate (PROAIR HFA) 90 mcg/actuation HFA aerosol inhaler Inhale two puffs by mouth into the lungs every 6 hours as needed for Wheezing. ? bumetanide (BUMEX) 1 mg tablet Take 1 tablet by mouth twice daily.   ? carvedilol (COREG) 25 mg tablet Take 1 Tab by mouth twice daily.   ? ELIQUIS 5 mg tablet Take 1 tablet by mouth twice daily.   ? glimepiride (AMARYL) 2 mg tablet Take 1 tablet by mouth daily.   ? ibuprofen (ADVIL) 200 mg PO tablet Take 200 mg by mouth every 6 hours as needed.   ? levothyroxine (SYNTHROID) 200 mcg tablet Take 1 tablet by mouth daily.   ? lisinopriL (ZESTRIL) 40 mg tablet TAKE ONE TABLET BY MOUTH EVERY DAY   ? metFORMIN-XR (GLUCOPHAGE XR) 500 mg extended release tablet Take 500 mg by mouth daily.   ? metOLazone (ZAROXOLYN) 2.5 mg tablet Take 1 tablet by mouth every 48 hours.   ? potassium chloride (K-TAB) 20 mEq tablet Take 20 mEq by mouth daily.   ? spironolactone (ALDACTONE) 25 mg tablet Take one tablet by mouth daily. Take with food.   ? traMADoL (ULTRAM) 50 mg tablet Take 1 tablet by mouth as Needed.

## 2020-07-13 ENCOUNTER — Encounter: Admit: 2020-07-13 | Discharge: 2020-07-13 | Payer: MEDICARE

## 2020-07-17 ENCOUNTER — Encounter: Admit: 2020-07-17 | Discharge: 2020-07-17 | Payer: MEDICARE

## 2020-07-17 NOTE — Telephone Encounter
Spoke with patient regarding her needing follow up labs drawn at [facility] this week. Pt verbalized understanding and states she will likely go tomorrow to have them drawn since the weather is cold and icy there today. Pt verbalized understanding of HF appt date time and location with Dr. Adah Perl on 3/10 in [place], [place].

## 2020-07-17 NOTE — Telephone Encounter
-----   Message from Donetta Potts, RN sent at 07/10/2020  2:41 PM CST -----  Regarding: F/U labs  Pt needs f/u BMP on/about 3/7 at [facility] in [place]

## 2020-07-18 ENCOUNTER — Encounter: Admit: 2020-07-18 | Discharge: 2020-07-18 | Payer: MEDICARE

## 2020-07-18 DIAGNOSIS — I10 Essential (primary) hypertension: Secondary | ICD-10-CM

## 2020-07-18 LAB — BASIC METABOLIC PANEL
Lab: 0.9 K/UL — ABNORMAL LOW (ref 4.5–11.0)
Lab: 13
Lab: 140
Lab: 3.9
Lab: 30
Lab: 95 — ABNORMAL LOW (ref 98–107)

## 2020-07-19 ENCOUNTER — Encounter: Admit: 2020-07-19 | Discharge: 2020-07-19 | Payer: MEDICARE

## 2020-07-19 DIAGNOSIS — E039 Hypothyroidism, unspecified: Secondary | ICD-10-CM

## 2020-07-19 DIAGNOSIS — E119 Type 2 diabetes mellitus without complications: Secondary | ICD-10-CM

## 2020-07-19 DIAGNOSIS — C801 Malignant (primary) neoplasm, unspecified: Secondary | ICD-10-CM

## 2020-07-19 DIAGNOSIS — R0600 Dyspnea, unspecified: Secondary | ICD-10-CM

## 2020-07-19 DIAGNOSIS — R51 Headache: Secondary | ICD-10-CM

## 2020-07-19 DIAGNOSIS — H547 Unspecified visual loss: Secondary | ICD-10-CM

## 2020-07-19 DIAGNOSIS — I00 Rheumatic fever without heart involvement: Secondary | ICD-10-CM

## 2020-07-19 DIAGNOSIS — M797 Fibromyalgia: Secondary | ICD-10-CM

## 2020-07-19 DIAGNOSIS — M549 Dorsalgia, unspecified: Secondary | ICD-10-CM

## 2020-07-19 DIAGNOSIS — C50919 Malignant neoplasm of unspecified site of unspecified female breast: Secondary | ICD-10-CM

## 2020-07-19 DIAGNOSIS — I253 Aneurysm of heart: Secondary | ICD-10-CM

## 2020-07-19 DIAGNOSIS — I358 Other nonrheumatic aortic valve disorders: Secondary | ICD-10-CM

## 2020-07-19 DIAGNOSIS — K8020 Calculus of gallbladder without cholecystitis without obstruction: Secondary | ICD-10-CM

## 2020-07-19 DIAGNOSIS — G459 Transient cerebral ischemic attack, unspecified: Secondary | ICD-10-CM

## 2020-07-19 DIAGNOSIS — B999 Unspecified infectious disease: Secondary | ICD-10-CM

## 2020-07-19 DIAGNOSIS — E785 Hyperlipidemia, unspecified: Secondary | ICD-10-CM

## 2020-07-19 DIAGNOSIS — I5022 Chronic systolic (congestive) heart failure: Secondary | ICD-10-CM

## 2020-07-19 DIAGNOSIS — M1990 Unspecified osteoarthritis, unspecified site: Secondary | ICD-10-CM

## 2020-07-19 DIAGNOSIS — I340 Nonrheumatic mitral (valve) insufficiency: Secondary | ICD-10-CM

## 2020-07-19 DIAGNOSIS — J431 Panlobular emphysema: Secondary | ICD-10-CM

## 2020-07-19 DIAGNOSIS — M810 Age-related osteoporosis without current pathological fracture: Secondary | ICD-10-CM

## 2020-07-19 DIAGNOSIS — I10 Essential (primary) hypertension: Secondary | ICD-10-CM

## 2020-07-19 DIAGNOSIS — C539 Malignant neoplasm of cervix uteri, unspecified: Secondary | ICD-10-CM

## 2020-08-08 ENCOUNTER — Encounter: Admit: 2020-08-08 | Discharge: 2020-08-08 | Payer: BC Managed Care – PPO

## 2020-08-08 ENCOUNTER — Encounter: Admit: 2020-08-08 | Discharge: 2020-08-08 | Payer: MEDICARE

## 2020-08-08 DIAGNOSIS — J431 Panlobular emphysema: Secondary | ICD-10-CM

## 2020-08-08 DIAGNOSIS — M810 Age-related osteoporosis without current pathological fracture: Secondary | ICD-10-CM

## 2020-08-08 DIAGNOSIS — M797 Fibromyalgia: Secondary | ICD-10-CM

## 2020-08-08 DIAGNOSIS — R079 Chest pain, unspecified: Secondary | ICD-10-CM

## 2020-08-08 DIAGNOSIS — I340 Nonrheumatic mitral (valve) insufficiency: Secondary | ICD-10-CM

## 2020-08-08 DIAGNOSIS — C50919 Malignant neoplasm of unspecified site of unspecified female breast: Secondary | ICD-10-CM

## 2020-08-08 DIAGNOSIS — E039 Hypothyroidism, unspecified: Secondary | ICD-10-CM

## 2020-08-08 DIAGNOSIS — I10 Essential (primary) hypertension: Secondary | ICD-10-CM

## 2020-08-08 DIAGNOSIS — C539 Malignant neoplasm of cervix uteri, unspecified: Secondary | ICD-10-CM

## 2020-08-08 DIAGNOSIS — K8020 Calculus of gallbladder without cholecystitis without obstruction: Secondary | ICD-10-CM

## 2020-08-08 DIAGNOSIS — I00 Rheumatic fever without heart involvement: Secondary | ICD-10-CM

## 2020-08-08 DIAGNOSIS — G459 Transient cerebral ischemic attack, unspecified: Secondary | ICD-10-CM

## 2020-08-08 DIAGNOSIS — I272 Pulmonary hypertension, unspecified: Secondary | ICD-10-CM

## 2020-08-08 DIAGNOSIS — R0600 Dyspnea, unspecified: Secondary | ICD-10-CM

## 2020-08-08 DIAGNOSIS — E785 Hyperlipidemia, unspecified: Secondary | ICD-10-CM

## 2020-08-08 DIAGNOSIS — I358 Other nonrheumatic aortic valve disorders: Secondary | ICD-10-CM

## 2020-08-08 DIAGNOSIS — R51 Headache: Secondary | ICD-10-CM

## 2020-08-08 DIAGNOSIS — M549 Dorsalgia, unspecified: Secondary | ICD-10-CM

## 2020-08-08 DIAGNOSIS — E119 Type 2 diabetes mellitus without complications: Secondary | ICD-10-CM

## 2020-08-08 DIAGNOSIS — M1990 Unspecified osteoarthritis, unspecified site: Secondary | ICD-10-CM

## 2020-08-08 DIAGNOSIS — C801 Malignant (primary) neoplasm, unspecified: Secondary | ICD-10-CM

## 2020-08-08 DIAGNOSIS — I253 Aneurysm of heart: Secondary | ICD-10-CM

## 2020-08-08 DIAGNOSIS — I5022 Chronic systolic (congestive) heart failure: Secondary | ICD-10-CM

## 2020-08-08 DIAGNOSIS — B999 Unspecified infectious disease: Secondary | ICD-10-CM

## 2020-08-08 DIAGNOSIS — H547 Unspecified visual loss: Secondary | ICD-10-CM

## 2020-08-08 MED ORDER — ACETAMINOPHEN 325 MG PO TAB
650 mg | ORAL | 0 refills | PRN
Start: 2020-08-08 — End: ?

## 2020-08-08 MED ORDER — NITROGLYCERIN 0.3 MG SL SUBL
.4 mg | SUBLINGUAL | 0 refills | PRN
Start: 2020-08-08 — End: ?

## 2020-08-08 MED ORDER — ALUMINUM-MAGNESIUM HYDROXIDE 200-200 MG/5 ML PO SUSP
30 mL | ORAL | 0 refills | PRN
Start: 2020-08-08 — End: ?

## 2020-08-08 MED ORDER — BUMETANIDE 2 MG PO TAB
2 mg | ORAL_TABLET | Freq: Two times a day (BID) | ORAL | 3 refills | Status: AC
Start: 2020-08-08 — End: ?

## 2020-08-08 MED ORDER — TEMAZEPAM 7.5 MG PO CAP
15 mg | Freq: Every evening | ORAL | 0 refills | PRN
Start: 2020-08-08 — End: ?

## 2020-08-08 MED ORDER — DOCUSATE SODIUM 100 MG PO CAP
100 mg | Freq: Every day | ORAL | 0 refills | PRN
Start: 2020-08-08 — End: ?

## 2020-08-08 MED ORDER — ASPIRIN 81 MG PO CHEW
324 mg | Freq: Once | ORAL | 0 refills
Start: 2020-08-08 — End: ?

## 2020-08-08 MED ORDER — FARXIGA 10 MG PO TAB
10 mg | ORAL_TABLET | Freq: Every day | ORAL | 3 refills | Status: AC
Start: 2020-08-08 — End: ?

## 2020-08-08 NOTE — Patient Instructions
CARDIAC CATHETERIZATION   PRE-ADMISSION INSTRUCTIONS    Patient Name: Kimberly Mitchell  MRN#: [id number]  Date of Birth: 25-May-1940 (80 y.o.)  Today's Date: 08/08/2020    PROCEDURE:  You are scheduled for a Left Heart Pressures Evaluation and Right Heart Pressures Evaluation with Dr. Elayne Snare.    PROCEDURE DATE AND ARRIVAL TIME:    Your procedure date is 08/17/20.  You will receive a call from the Cath lab staff between 8:00 a.m. and noon on the business day prior to your procedure to let you know at what time to arrive on the day of your procedure.    Please check in at the Admitting Desk in the [facility] for your procedure. [facility] Entrance and and take a right. Continue down the hallway past the Cardiovascular Medicine office. That hall will take you into the [facility]. Check in at the desk on the left side.)     (If you have further questions regarding your arrival time for the CV lab, please call [phone] by 3:00pm the day before your procedure. Please leave a message with your name and number, your call will be returned in a timely manner.)    PRE-PROCEDURE APPOINTMENTS:             Pre-Admission lab work required within 14 days of procedure: BMP, CBC and Magnesium at the lab of your choice.                 FOOD AND DRINK INSTRUCTIONS  Nothing to eat after midnight before your procedure. No caffeine for 24 hours prior to your procedure. You will be under moderate sedation for your procedure.  You may drink clear liquids up to an hour before hospital arrival. This will be confirmed by the Cath lab staff the day before your procedure.     SPECIAL MEDICATION INSTRUCTIONS  Any new prescriptions will be sent to your pharmacy listed on file with [place].        apixaban (Eliquis): hold 2 days and AM of procedure. Last dose on 08/14/20.   Diuretics: Aldactone (spironolactone) -- hold the morning of your procedure. , Bumex (bumetanide) -- hold the morning of your procedure.  and Zaroxolyn (metolazone) -- hold the morning of your procedure.   Hypoglycemics: metformin (Glucophage) -- hold the morning of your procedure.  and glimepiride (Amaryl) -- hold the morning of your procedure.        TAKE AM OF PROCEDURE:  HOLD ALL over the counter vitamins or supplements on the morning of your procedure.      Additional Instructions  If you wear CPAP, please bring your mask and machine with you to the hospital.    Take a bath or shower with anti-bacterial soap the evening before, or the morning of the procedure.     Bring photo ID and your health insurance card(s).    Arrange for a driver to take you home from the hospital. Please arrange for a friend or family member to take you home from this test. You cannot take a Taxi, Benedetto Goad, or public transportation as there has to be a responsible person to help care for you after sedation    Bring an accurate list of your current medications with you to the hospital (all medications and supplements taken daily).  Please use the medication list below and write in the date and time when you took your last dose before your procedure. Update this list of medications as needed.  Wear comfortable clothes and don't bring valuables, other than photo identification card, with you to the hospital.    Please pack a bag for an overnight stay.     Please review your pre-procedure instructions and bring them with you on the day of your procedure.  Call the office at [phone] with any questions. You may ask to speak with Dr. Cecile Sheerer nurse.        ALLERGIES  Allergies   Allergen Reactions   ? Diltiazem EDEMA   ? Metronidazole ITCHING and SEE COMMENTS     Low heart rate   ? Methylprednisone UNKNOWN       CURRENT MEDICATIONS  Outpatient Encounter Medications as of 08/08/2020   Medication Sig Dispense Refill   ? albuterol sulfate (PROAIR HFA) 90 mcg/actuation HFA aerosol inhaler Inhale two puffs by mouth into the lungs every 6 hours as needed for Wheezing. 2 g 0   ? bumetanide (BUMEX) 2 mg tablet Take one tablet by mouth twice daily. Take 2 tabs in the morning and 1 tab in the evening daily 180 tablet 3   ? carvedilol (COREG) 25 mg tablet Take 1 Tab by mouth twice daily. 180 Tab 3   ? dapagliflozin (FARXIGA) 10 mg tablet Take one tablet by mouth daily. 90 tablet 3   ? ELIQUIS 5 mg tablet Take 1 tablet by mouth twice daily.     ? glimepiride (AMARYL) 2 mg tablet Take 1 tablet by mouth daily.     ? ibuprofen (ADVIL) 200 mg PO tablet Take 200 mg by mouth every 6 hours as needed.     ? levothyroxine (SYNTHROID) 200 mcg tablet Take 1 tablet by mouth daily.     ? lisinopriL (ZESTRIL) 40 mg tablet TAKE ONE TABLET BY MOUTH EVERY DAY 90 tablet 3   ? metFORMIN-XR (GLUCOPHAGE XR) 500 mg extended release tablet Take 500 mg by mouth daily.     ? metOLazone (ZAROXOLYN) 2.5 mg tablet Take 1 tablet by mouth every 48 hours.     ? potassium chloride (K-TAB) 20 mEq tablet Take 20 mEq by mouth daily.     ? spironolactone (ALDACTONE) 25 mg tablet Take one tablet by mouth daily. Take with food. 90 tablet 3   ? traMADoL (ULTRAM) 50 mg tablet Take 1 tablet by mouth as Needed.       No facility-administered encounter medications on file as of 08/08/2020.       _________________________________________  Form completed by: Weston Brass  Date completed: 08/08/20  Method: In person and given to the patient.           Coronary Angiography  Angiography is a special type of moving X-ray that lets your doctor view your coronary arteries to see if the blood vessels to your heart are narrowed or blocked. This test is done when someone is having a heart attack. Or it may be done if symptoms may mean a heart attack. It also may be done after an abnormal cardiac stress test.   ?Before the procedure   ? Tell your healthcare team what medicines you take and any allergies you may have.  ? Tell your healthcare team if you've had a reaction to contrast dye or have had any kidney problems.  ? Follow any directions you are given for not eating or drinking before surgery.  ? A nurse will place an IV (intravenous) catheter in your vein to give fluids, and medicine to relieve pain and help you feel less  anxious.  ? They clean your skin and shave the area where the catheter will be inserted, if needed.    During the procedure   ? You will lie on a table with a portable X-ray machine over you. The team will place a surgical drape over your body. The area where the doctor chooses to insert the catheter will be cleaned. This will be either a wrist or the groin.  ? Your doctor will place a long, thin tube (catheter) inside an artery in your groin or arm and guide it into your heart. You may feel pressure with the insertion of the catheter. A numbing medicine often is injected at the insertion site. This eases discomfort during the procedure.  ? They will inject a contrast dye through the catheter into your blood vessels or heart chambers. You may feel a warm sensation or feeling like you have to urinate when the contrast is injected. This is normal.  ? X-rays are taken to show images of the inside of your heart and coronary arteries.     The catheter can be placed into the groin, arm, or wrist.     After the procedure   ? Your healthcare team will tell you how long to lie down and keep the insertion site still. The amount of time may depend on whether a closure device such as a stitch or collagen plug was used to close the opening made in your artery. The time you must be still may be shorter if one of these devices was used. The amount of time will also depend on if there is any bleeding at the catheter insertion site.  ? If the insertion site was in your groin, you may need to lie down with your leg still for several hours. If the insertion site was in your wrist, a pressure bandage may be put on the site. Or you may have closure device placed on the insertion site. It will be taken off when there is no sign of bleeding. If bleeding occurs, a nurse will put pressure on the area to control it.  ? A nurse will check your blood pressure and the insertion site often. This is to make sure you remain stable after the procedure.  ? You may be asked to drink fluid to help flush the contrast liquid out of your system.  ? Have someone drive you home from the hospital.  ? If your doctor uses angioplasty or a stent to treat a blocked artery, you may stay the night in the hospital. If there are multiple blockages that can't be fixed with a stent or angioplasty, you may need surgery to bypass the blockages. This is called coronary artery bypass graft surgery. Your doctor will explain the results of your test and what treatment options that may be best for you.  ? It?s normal to find a small bruise or lump at the insertion site. The lump may be the collagen plug or stitch that you feel, or a small bruise. These common side effects should disappear within a few weeks.  ? You will be given instructions by your healthcare team on recovering from the coronary angiography. In general, don't lift anything heavier than a gallon of milk for several days. This gives time for the puncture site in the artery wall to heal. Try not to get the puncture site wet. Don't put it under water. Showers are OK. Don't soak in a bathtub, swimming pool, or hot tub until the  skin has healed.    When to call your healthcare provider   Call your healthcare provider right away if you have any of these:   ? Symptoms of infection. These include pain, swelling, redness, bleeding, or drainage at the insertion site.  ? Fever of 100.4?F (38?C) or higher, or as advised by your provider  ? Bleeding, bruising, or a lot of swelling where the catheter was inserted  ? Blood in your urine  ? Black or tarry stools  ? Any unusual bleeding  ? Irregular, very slow, or fast heartbeat  ? Dizziness  Call 911  Call 911if any of these occur:     ? Chest pain  ? Shortness of breath  ? Sudden numbness or weakness in arms, legs, or face, or difficulty speaking  ? The puncture site swells up very fast  ? Bleeding from the puncture site that does not slow down with firm pressure  ? Severe or increasing pain, numbness, coldness, or a bluish color in the leg or arm that held the catheter    [employer] last reviewed this educational content on 06/13/2020    ? 2000-2022 [employer], [place]. All rights reserved. This information is not intended as a substitute for professional medical care. Always follow your healthcare professional's instructions.

## 2020-08-09 ENCOUNTER — Encounter: Admit: 2020-08-09 | Discharge: 2020-08-09 | Payer: BC Managed Care – PPO

## 2020-08-09 NOTE — Progress Notes
Website with [place], [email or web address], confirmed benefits and eligibility:  Current and active since 05/13/2018, $3000 deductible with required co-insurance of 20% to max OOP $4000, then plan will pay 100% of allowable charges.  This plan is primary over [facility] Replacement Plan.  Prior Josem Kaufmann is not required for [facility] [address].    website with [email or web address], confirmed benefits and eligibility:  Current and active since 05/13/2020, $295 copayment to the hospital to max OOP $3900, then plan will pay 100% of allowable charges.  Pre-certification through [employer], [phone] may be required for [facility] [address].  I called [phone] and spoke with Webb Silversmith in precert department who reported that prior auth is not required for [address] when secondary to a commercial plan.  Reference #[id number]

## 2020-08-17 ENCOUNTER — Encounter: Admit: 2020-08-17 | Discharge: 2020-08-17 | Payer: BC Managed Care – PPO

## 2020-08-17 DIAGNOSIS — M549 Dorsalgia, unspecified: Secondary | ICD-10-CM

## 2020-08-17 DIAGNOSIS — M797 Fibromyalgia: Secondary | ICD-10-CM

## 2020-08-17 DIAGNOSIS — R0600 Dyspnea, unspecified: Secondary | ICD-10-CM

## 2020-08-17 DIAGNOSIS — G459 Transient cerebral ischemic attack, unspecified: Secondary | ICD-10-CM

## 2020-08-17 DIAGNOSIS — H547 Unspecified visual loss: Secondary | ICD-10-CM

## 2020-08-17 DIAGNOSIS — C50919 Malignant neoplasm of unspecified site of unspecified female breast: Secondary | ICD-10-CM

## 2020-08-17 DIAGNOSIS — E119 Type 2 diabetes mellitus without complications: Secondary | ICD-10-CM

## 2020-08-17 DIAGNOSIS — E785 Hyperlipidemia, unspecified: Secondary | ICD-10-CM

## 2020-08-17 DIAGNOSIS — I5022 Chronic systolic (congestive) heart failure: Secondary | ICD-10-CM

## 2020-08-17 DIAGNOSIS — B999 Unspecified infectious disease: Secondary | ICD-10-CM

## 2020-08-17 DIAGNOSIS — I253 Aneurysm of heart: Secondary | ICD-10-CM

## 2020-08-17 DIAGNOSIS — I10 Essential (primary) hypertension: Secondary | ICD-10-CM

## 2020-08-17 DIAGNOSIS — I358 Other nonrheumatic aortic valve disorders: Secondary | ICD-10-CM

## 2020-08-17 DIAGNOSIS — M810 Age-related osteoporosis without current pathological fracture: Secondary | ICD-10-CM

## 2020-08-17 DIAGNOSIS — I340 Nonrheumatic mitral (valve) insufficiency: Secondary | ICD-10-CM

## 2020-08-17 DIAGNOSIS — J431 Panlobular emphysema: Secondary | ICD-10-CM

## 2020-08-17 DIAGNOSIS — K8020 Calculus of gallbladder without cholecystitis without obstruction: Secondary | ICD-10-CM

## 2020-08-17 DIAGNOSIS — M1990 Unspecified osteoarthritis, unspecified site: Secondary | ICD-10-CM

## 2020-08-17 DIAGNOSIS — C801 Malignant (primary) neoplasm, unspecified: Secondary | ICD-10-CM

## 2020-08-17 DIAGNOSIS — I00 Rheumatic fever without heart involvement: Secondary | ICD-10-CM

## 2020-08-17 DIAGNOSIS — C539 Malignant neoplasm of cervix uteri, unspecified: Secondary | ICD-10-CM

## 2020-08-17 DIAGNOSIS — E039 Hypothyroidism, unspecified: Secondary | ICD-10-CM

## 2020-08-17 DIAGNOSIS — R51 Headache: Secondary | ICD-10-CM

## 2020-08-17 MED ADMIN — POTASSIUM CHLORIDE IN WATER 10 MEQ/50 ML IV PGBK [11075]: 10 meq | INTRAVENOUS | @ 15:00:00 | Stop: 2020-08-17 | NDC 00338070541

## 2020-08-17 MED ADMIN — POTASSIUM CHLORIDE IN WATER 10 MEQ/50 ML IV PGBK [11075]: 10 meq | INTRAVENOUS | @ 17:00:00 | Stop: 2020-08-17 | NDC 00338070541

## 2020-08-17 MED ADMIN — POTASSIUM CHLORIDE IN WATER 10 MEQ/50 ML IV PGBK [11075]: 10 meq | INTRAVENOUS | @ 15:00:00 | Stop: 2020-08-17 | NDC 00409707514

## 2020-08-17 MED ADMIN — POTASSIUM CHLORIDE IN WATER 10 MEQ/50 ML IV PGBK [11075]: 10 meq | INTRAVENOUS | @ 16:00:00 | Stop: 2020-08-17 | NDC 00338070541

## 2020-08-17 MED ADMIN — ASPIRIN 81 MG PO CHEW [680]: 324 mg | ORAL | @ 14:00:00 | Stop: 2020-08-17 | NDC 66553000201

## 2020-08-17 MED ADMIN — ACETAMINOPHEN 325 MG PO TAB [101]: 650 mg | ORAL | @ 16:00:00 | Stop: 2020-08-17 | NDC 00904677361

## 2020-08-17 MED ADMIN — LISINOPRIL 20 MG PO TAB [4526]: 40 mg | ORAL | @ 14:00:00 | Stop: 2020-08-17 | NDC 00904679961

## 2020-08-17 MED ADMIN — CARVEDILOL 25 MG PO TAB [77255]: 25 mg | ORAL | @ 14:00:00 | Stop: 2020-08-17 | NDC 00904630361

## 2020-08-18 ENCOUNTER — Encounter: Admit: 2020-08-18 | Discharge: 2020-08-18 | Payer: BC Managed Care – PPO

## 2020-08-18 DIAGNOSIS — I340 Nonrheumatic mitral (valve) insufficiency: Secondary | ICD-10-CM

## 2020-08-18 NOTE — Patient Instructions
PRE-ADMISSION INSTRUCTIONS    Patient Name: Kimberly Mitchell  MRN#: [id number]  Date of Birth: March 28, 1941 (79 y.o.)  Today's Date: 08/18/2020      Dear Ms. Gilberti    You have been scheduled for surgery with Dr. Landis Gandy and Dr. Cresenciano Lick on 09/01/20.Per the recommendations of your doctors, you will need to be off your Eliquis (apixaban) for 3 days before your procedure. Your last dose will be 08/28/20 (take). You should stop your Eliquis on 08/29/20 and after.      You may continue to take all of your other medications. You will be given instructions on any other medications to hold the day of surgery at your Pre-Op appointment.    Please reach out to valve clinic nurse coordinators, Haskell Rihn or [place] with questions: [phone].    Sincerely,          Jakhi Dishman, RN & Vikki Ports, RN

## 2020-08-21 ENCOUNTER — Encounter: Admit: 2020-08-21 | Discharge: 2020-08-21 | Payer: BC Managed Care – PPO

## 2020-08-21 ENCOUNTER — Inpatient Hospital Stay: Admit: 2020-08-21 | Discharge: 2020-08-21 | Payer: BC Managed Care – PPO

## 2020-08-21 DIAGNOSIS — Z20822 Encounter for screening laboratory testing for COVID-19 virus in asymptomatic patient: Secondary | ICD-10-CM

## 2020-08-21 DIAGNOSIS — I340 Nonrheumatic mitral (valve) insufficiency: Secondary | ICD-10-CM

## 2020-08-21 NOTE — Patient Instructions
PRE-ADMISSION INSTRUCTIONS    Patient Name: Kimberly Mitchell  MRN#: [id number]  Date of Birth: February 01, 1941 (79 y.o.)  Today's Date: 08/21/2020        Dear Ms. Georgina Peer,    You have been scheduled for surgery with Dr. Landis Gandy and Dr. Cresenciano Lick on 09/01/20.    Per the recommendations of your doctors, you will need to be off your Eliquis (apixaban) for 3 days before your procedure. Your last dose will be 08/28/20 (take). You should stop your Eliquis on 08/29/20 and after.      You may continue to take all of your other medications. You will be given instructions on any other medications to hold the day of surgery at your Pre-Op appointment.    Please reach out to valve clinic nurse coordinators, Kratos Ruscitti or [place] with questions: [phone].    Sincerely,          Priscillia Fouch, RN & Vikki Ports, RN

## 2020-08-25 ENCOUNTER — Encounter: Admit: 2020-08-25 | Discharge: 2020-08-25 | Payer: BC Managed Care – PPO

## 2020-08-25 DIAGNOSIS — I340 Nonrheumatic mitral (valve) insufficiency: Secondary | ICD-10-CM

## 2020-08-25 MED ORDER — SODIUM CHLORIDE 0.9 % IV SOLP
INTRAVENOUS | 0 refills
Start: 2020-08-25 — End: ?

## 2020-08-25 MED ORDER — ASPIRIN 81 MG PO CHEW
81 mg | Freq: Every day | ORAL | 0 refills
Start: 2020-08-25 — End: ?

## 2020-08-25 MED ORDER — LIDOCAINE (PF) 10 MG/ML (1 %) IJ SOLN
.2 mL | INTRAMUSCULAR | 0 refills | PRN
Start: 2020-08-25 — End: ?

## 2020-08-30 ENCOUNTER — Encounter: Admit: 2020-08-30 | Discharge: 2020-08-30 | Payer: BC Managed Care – PPO

## 2020-08-30 ENCOUNTER — Inpatient Hospital Stay: Admit: 2020-08-30 | Payer: BC Managed Care – PPO

## 2020-08-30 ENCOUNTER — Inpatient Hospital Stay: Admit: 2020-08-30 | Discharge: 2020-08-30 | Payer: BC Managed Care – PPO

## 2020-08-30 ENCOUNTER — Ambulatory Visit: Admit: 2020-08-30 | Discharge: 2020-08-30 | Payer: BC Managed Care – PPO

## 2020-08-30 DIAGNOSIS — I253 Aneurysm of heart: Secondary | ICD-10-CM

## 2020-08-30 DIAGNOSIS — E782 Mixed hyperlipidemia: Secondary | ICD-10-CM

## 2020-08-30 DIAGNOSIS — E119 Type 2 diabetes mellitus without complications: Secondary | ICD-10-CM

## 2020-08-30 DIAGNOSIS — E039 Hypothyroidism, unspecified: Secondary | ICD-10-CM

## 2020-08-30 DIAGNOSIS — C801 Malignant (primary) neoplasm, unspecified: Secondary | ICD-10-CM

## 2020-08-30 DIAGNOSIS — M797 Fibromyalgia: Secondary | ICD-10-CM

## 2020-08-30 DIAGNOSIS — C50919 Malignant neoplasm of unspecified site of unspecified female breast: Secondary | ICD-10-CM

## 2020-08-30 DIAGNOSIS — I10 Essential (primary) hypertension: Secondary | ICD-10-CM

## 2020-08-30 DIAGNOSIS — I071 Rheumatic tricuspid insufficiency: Secondary | ICD-10-CM

## 2020-08-30 DIAGNOSIS — I340 Nonrheumatic mitral (valve) insufficiency: Secondary | ICD-10-CM

## 2020-08-30 DIAGNOSIS — R51 Headache: Secondary | ICD-10-CM

## 2020-08-30 DIAGNOSIS — G459 Transient cerebral ischemic attack, unspecified: Secondary | ICD-10-CM

## 2020-08-30 DIAGNOSIS — H547 Unspecified visual loss: Secondary | ICD-10-CM

## 2020-08-30 DIAGNOSIS — M810 Age-related osteoporosis without current pathological fracture: Secondary | ICD-10-CM

## 2020-08-30 DIAGNOSIS — I5022 Chronic systolic (congestive) heart failure: Secondary | ICD-10-CM

## 2020-08-30 DIAGNOSIS — C539 Malignant neoplasm of cervix uteri, unspecified: Secondary | ICD-10-CM

## 2020-08-30 DIAGNOSIS — I00 Rheumatic fever without heart involvement: Secondary | ICD-10-CM

## 2020-08-30 DIAGNOSIS — M549 Dorsalgia, unspecified: Secondary | ICD-10-CM

## 2020-08-30 DIAGNOSIS — Z20822 Encounter for screening laboratory testing for COVID-19 virus in asymptomatic patient: Secondary | ICD-10-CM

## 2020-08-30 DIAGNOSIS — K8020 Calculus of gallbladder without cholecystitis without obstruction: Secondary | ICD-10-CM

## 2020-08-30 DIAGNOSIS — M1990 Unspecified osteoarthritis, unspecified site: Secondary | ICD-10-CM

## 2020-08-30 DIAGNOSIS — J431 Panlobular emphysema: Secondary | ICD-10-CM

## 2020-08-30 DIAGNOSIS — R0600 Dyspnea, unspecified: Secondary | ICD-10-CM

## 2020-08-30 DIAGNOSIS — I428 Other cardiomyopathies: Secondary | ICD-10-CM

## 2020-08-30 DIAGNOSIS — E785 Hyperlipidemia, unspecified: Secondary | ICD-10-CM

## 2020-08-30 DIAGNOSIS — B999 Unspecified infectious disease: Secondary | ICD-10-CM

## 2020-08-30 DIAGNOSIS — I358 Other nonrheumatic aortic valve disorders: Secondary | ICD-10-CM

## 2020-08-30 DIAGNOSIS — I5042 Chronic combined systolic (congestive) and diastolic (congestive) heart failure: Secondary | ICD-10-CM

## 2020-08-30 LAB — CBC AND DIFF
Lab: 11 g/dL — ABNORMAL LOW (ref 12.0–15.0)
Lab: 17 % — ABNORMAL HIGH (ref 11–15)
Lab: 199 K/UL (ref 150–400)
Lab: 2 % (ref 0–5)
Lab: 20 % — ABNORMAL LOW (ref 24–44)
Lab: 28 pg (ref 26–34)
Lab: 3.9 M/UL — ABNORMAL LOW (ref 4.0–5.0)
Lab: 32 g/dL (ref 32.0–36.0)
Lab: 34 % — ABNORMAL LOW (ref 36–45)
Lab: 4.5 K/UL (ref 4.5–11.0)
Lab: 86 FL (ref 80–100)
Lab: 9 FL (ref 7–11)

## 2020-08-30 LAB — URINALYSIS DIPSTICK REFLEX TO CULTURE
Lab: NEGATIVE
Lab: NEGATIVE
Lab: NEGATIVE
Lab: NEGATIVE
Lab: NEGATIVE mL/min — ABNORMAL LOW
Lab: POSITIVE — AB

## 2020-08-30 LAB — HEMOGLOBIN A1C: Lab: 8.8 % — ABNORMAL HIGH (ref 4.0–6.0)

## 2020-08-30 LAB — COMPREHENSIVE METABOLIC PANEL
Lab: 0.8 mg/dL (ref 0.3–1.2)
Lab: 0.9 mg/dL (ref 0.4–1.00)
Lab: 142 MMOL/L (ref 137–147)
Lab: 19 mg/dL (ref 7–25)
Lab: 231 mg/dL — ABNORMAL HIGH (ref 70–100)
Lab: 4 MMOL/L (ref 3.5–5.1)
Lab: 7.6 g/dL (ref 6.0–8.0)
Lab: 9.1 mg/dL (ref 8.5–10.6)

## 2020-08-30 LAB — COVID-19 (SARS-COV-2) PCR

## 2020-08-30 LAB — MAGNESIUM: Lab: 1.8 mg/dL (ref 1.6–2.6)

## 2020-08-30 LAB — POC GLUCOSE
Lab: 253 mg/dL — ABNORMAL HIGH (ref 70–100)
Lab: 233 mg/dL — ABNORMAL HIGH (ref 70–100)

## 2020-08-30 LAB — URINALYSIS MICROSCOPIC REFLEX TO CULTURE

## 2020-08-30 LAB — BNP (B-TYPE NATRIURETIC PEPTI): Lab: 460 pg/mL — ABNORMAL HIGH (ref <0.15)

## 2020-08-30 LAB — TROPONIN-I: Lab: 0 ng/mL (ref 0.0–0.05)

## 2020-08-30 LAB — TSH WITH FREE T4 REFLEX: Lab: 21 uU/mL — ABNORMAL HIGH (ref 0.35–5.00)

## 2020-08-30 LAB — FREE T4-FREE THYROXINE: Lab: 0.6 ng/dL (ref 0.6–1.6)

## 2020-08-30 MED ORDER — SPIRONOLACTONE 25 MG PO TAB
25 mg | Freq: Every day | ORAL | 0 refills | Status: AC
Start: 2020-08-30 — End: ?
  Administered 2020-08-31: 14:00:00 25 mg via ORAL

## 2020-08-30 MED ORDER — MAGNESIUM SULFATE IN D5W 1 GRAM/100 ML IV PGBK
1 g | Freq: Once | INTRAVENOUS | 0 refills | Status: CP
Start: 2020-08-30 — End: ?
  Administered 2020-08-30: 22:00:00 1 g via INTRAVENOUS

## 2020-08-30 MED ORDER — DAPAGLIFLOZIN 10 MG PO TAB
10 mg | Freq: Every day | ORAL | 0 refills | Status: AC
Start: 2020-08-30 — End: ?
  Administered 2020-08-31 – 2020-09-13 (×13): 10 mg via ORAL

## 2020-08-30 MED ORDER — INSULIN ASPART 100 UNIT/ML SC FLEXPEN
0-6 [IU] | Freq: Before meals | SUBCUTANEOUS | 0 refills | Status: AC
Start: 2020-08-30 — End: ?
  Administered 2020-08-30: 20:00:00 2 [IU] via SUBCUTANEOUS

## 2020-08-30 MED ORDER — LISINOPRIL 20 MG PO TAB
40 mg | Freq: Every day | ORAL | 0 refills | Status: AC
Start: 2020-08-30 — End: ?
  Administered 2020-08-31: 14:00:00 40 mg via ORAL

## 2020-08-30 MED ORDER — CARVEDILOL 25 MG PO TAB
25 mg | Freq: Two times a day (BID) | ORAL | 0 refills | Status: DC
Start: 2020-08-30 — End: 2020-08-30

## 2020-08-30 MED ORDER — BUMETANIDE 0.25 MG/ML IJ SOLN
4 mg | Freq: Two times a day (BID) | INTRAVENOUS | 0 refills | Status: AC
Start: 2020-08-30 — End: ?
  Administered 2020-08-30 – 2020-08-31 (×2): 4 mg via INTRAVENOUS

## 2020-08-30 MED ORDER — GLIMEPIRIDE 2 MG PO TAB
2 mg | Freq: Every day | ORAL | 0 refills | Status: DC
Start: 2020-08-30 — End: 2020-08-30

## 2020-08-30 MED ORDER — ALBUTEROL SULFATE 90 MCG/ACTUATION IN HFAA
2 | RESPIRATORY_TRACT | 0 refills | Status: AC | PRN
Start: 2020-08-30 — End: ?
  Administered 2020-08-31: 07:00:00 2 via RESPIRATORY_TRACT

## 2020-08-30 MED ORDER — ENOXAPARIN 80 MG/0.8 ML SC SYRG
1 mg/kg | Freq: Two times a day (BID) | SUBCUTANEOUS | 0 refills | Status: AC
Start: 2020-08-30 — End: ?
  Administered 2020-08-31 (×2): 70 mg via SUBCUTANEOUS

## 2020-08-30 MED ORDER — LEVOTHYROXINE 200 MCG PO TAB
200 ug | Freq: Every day | ORAL | 0 refills | Status: AC
Start: 2020-08-30 — End: ?
  Administered 2020-08-31 – 2020-09-13 (×13): 200 ug via ORAL

## 2020-08-30 MED ORDER — ATORVASTATIN 20 MG PO TAB
20 mg | Freq: Every day | ORAL | 0 refills | Status: AC
Start: 2020-08-30 — End: ?
  Administered 2020-08-31 – 2020-09-13 (×13): 20 mg via ORAL

## 2020-08-30 MED ORDER — CARVEDILOL 25 MG PO TAB
25 mg | Freq: Two times a day (BID) | ORAL | 0 refills | Status: AC
Start: 2020-08-30 — End: ?
  Administered 2020-08-30 – 2020-09-05 (×12): 25 mg via ORAL

## 2020-08-30 MED ORDER — HYDRALAZINE 10 MG PO TAB
10 mg | Freq: Once | ORAL | 0 refills | Status: CP
Start: 2020-08-30 — End: ?
  Administered 2020-08-30: 20:00:00 10 mg via ORAL

## 2020-08-30 NOTE — Progress Notes
Heart Failure Nursing Progress Note    Admission Date: 08/30/2020  LOS: 0 days    Admission Weight: 72.8 kg (160 lb 9.6 oz)        Most recent weights (inpatient):   Vitals:    08/30/20 1425 08/30/20 1713   Weight: 72.8 kg (160 lb 9.6 oz) 72.8 kg (160 lb 9.6 oz)     Weight change from previous day:N/A    Fluid restriction ordered: 1.5 L    Intake/Output Summary: (Last 24 hours)    Intake/Output Summary (Last 24 hours) at 08/30/2020 1830  Last data filed at 08/30/2020 1726  Gross per 24 hour   Intake 0 ml   Output 1650 ml   Net -1650 ml       Is patient incontinent No    Anticipated discharge date: TBD  Discharge goals: To diurese      Daily Assessment of Patient Stated Goals:    Short Term Goal Identified by patient (Short Term=during hospitalization):  To get fluid off

## 2020-08-30 NOTE — H&P (View-Only)
Admission History and Physical Examination      Name:  Kimberly Mitchell                                             MRN:  [id number]   Admission Date:  08/30/2020                     Assessment/Plan:    Principal Problem:    Volume overload  Active Problems:    Hypertension    Diabetes mellitus (HCC)    Hyperlipidemia    Hypothyroidism    Nonrheumatic mitral valve regurgitation    Chronic systolic heart failure (HCC)    Tricuspid regurgitation      Kimberly Mitchell is a 80 yo F with chronic combined systolic and diastolic heart failure, pulmonary hypertension, valvular heart disease (mitral regurgitation, aortic insufficiency, tricuspid regurgitation), HTN, hypothyroidism, atrial fibrillation, and PE admitted for HF exacerbation and mitraclip evaluation.     Acute on chronic combined HF  Volume overload   Hypertension  - TEE 07/10/20 with EF25%, valvular disease as below  - PTA prescribed bumex 2mg  BID however patient stopped taking due to miscommunication  - GDMT: coreg 25mg  BID, lisinopril 40mg , spironolactone 25mg , bumex 2mg  BID, metolazone 2.5mg  q48hr, faxiga   - Catheterization 08/17/20    - LHC 40% stenosis LAD, L main, Lcx, RCA free of significant disease   - RHC elevated filling pressures, mean PAP, SVR, PVR. CI 1.1 by Fick,     1.18 by TD. Mean gradient across MV 3.65mmHg with area 1.16 sq cm.   - PYP scan 08/26/19 negative for amyloidosis   - Dry weight around 145(?). Patient unsure. 160lb today.  Plan  > Will diurese with bumex 4mg  IV BID with goal net negative 2-3L  > BID BMP while aggressively diuresing  > Continue coreg, lisinopril, spironolactone. PRN hydralazine for systolic BP >180.  > Daily standing weights, strict I/O  > Chest xray to rule out other causes of dyspnea    Valvular heart disease  Mitral regurgitation  Aortic insufficiency  Tricuspid regurgitation  - TTE 04/2020 with EF 25-30%, mild aortic stenosis, moderate AR (area 1.17, mean gradient ) , moderate mitral valve calcification with severe regurgitation, severe TR. PASP .   - Most recent echo TEE 07/10/20 with EF25%, moderate to severe MR, severe TR, sclerotic aortic valve. Rightward bowing of interatrial septum due to elevated LA pressures.   - Following 04/2020 echo was referred to valve clinic and has been undergoing outpatient evaluation for mitraclip  - RHC/LHC as above  Plan  > Mitraclip placement tentatively scheduled for 4/22    Atrial fibrillation  - Coreg, eliquis (had not been taking, indication for PE?)  Plan  > EKG in clinic sinus tachycardia. Monitor on telemetry.   > Continue coreg  > Therapeutic lovenox for now; unclear what indication is for anticoagulation (afib vs DVT/PE) but will continue chart review and discussion with patient    HLD  > PTA atorvastatin     Type 2 Diabetes Mellitus   - Last A1c 8.8 08/30/20  Plan  > PTA regimen glimepiride 2mg , farxiga 10mg . Hold glimepiride.   > LDCF    Hypothyroidism  - Last TSH 22.81 in 2021  Plan  > Repeat TSH  > PTA levothyroxine for now      FEN: No IVF,  BID BMPs, cardiac diet  DVT ppx: Therapeutic lovenox  Code: FULL  Dispo: Admit to cardiology service, telemetry status.     Patient discussed with Dr. Duane Lope.     Johnnette Gourd, MD  PGY2 Internal Medicine   __________________________________________________________________________________  Primary Care Physician: Erskine Emery  Verified    Chief Complaint:  Shortness of breath   History of Present Illness: Kimberly Mitchell is a 80 y.o. female who presents today as direct admit from preoperative cardiology clinic. She was being seen prior to her scheduled mitraclip procedure Friday and was found to have worsening shortness of breath and symptoms of volume overload. She mistakenly stopped her diuresis and eliquis following her LHC. She returned to outside ED with shortness of breath, was given 1x dose IV lasix and then sent home. She continued to have worsening lower extremity edema, shortness of breath with exertion. She has been unable to sleep at night due to orthopnea. No chest pain or dizziness. Outpatient clinic was concerned for heart failure exacerbation with upcoming mitraclip so patient admitted to cardiology for aggressive diuresis.      Medical History:   Diagnosis Date   ? Aortic valve sclerosis    ? Arthritis    ? Atrial septal aneurysm    ? Back pain    ? Breast cancer (HCC)    ? Cancer of cervix (HCC)    ? Chronic systolic heart failure (HCC) 07/10/2020   ? Diabetes (HCC)    ? Dyspnea    ? Fibromyalgia    ? Gallstones    ? Headache(784.0)    ? Hyperlipidemia    ? Hypertension    ? Hypothyroidism    ? Infection    ? Mitral regurgitation    ? Osteoporosis    ? Other malignant neoplasm without specification of site [facility])    ? 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 Rheumatic fever     as a child    ? TIA (transient ischemic attack)    ? Vision decreased      Surgical History:   Procedure Laterality Date   ? ANGIOGRAPHY CORONARY ARTERY WITH RIGHT AND LEFT HEART CATHETERIZATION N/A 08/17/2020    Performed by Marcell Barlow, MD at [facility] CATH LAB   ? POSSIBLE PERCUTANEOUS CORONARY STENT PLACEMENT WITH ANGIOPLASTY N/A 08/17/2020    Performed by Marcell Barlow, MD at [facility] CATH LAB   ? COLONOSCOPY     ? HX CHOLECYSTECTOMY     ? HX HEART CATHETERIZATION     ? HX HYSTERECTOMY     ? MASTECTOMY Right      Family History   Problem Relation Age of Onset   ? Hypertension Mother    ? Heart Disease Mother    ? Cancer Father    ? Hypertension Father    ? Heart Disease Father    ? Cancer Sister    ? Heart Disease Sister    ? Hypertension Sister    ? Arthritis-rheumatoid Sister    ? Stroke Sister      Social History     Socioeconomic History   ? Marital status: Married     Spouse name: Not on file   ? Number of children: Not on file   ? Years of education: Not on file   ? Highest education level: Not on file   Occupational History   ? Not on file   Tobacco Use   ? Smoking status: Never Smoker   ?  Smokeless tobacco: Never Used   Substance and Sexual Activity   ? Alcohol use: No   ? Drug use: No   ? Sexual activity: Not on file   Other Topics Concern   ? Not on file   Social History Narrative   ? Not on file     Social Determinants of Health     Financial Resource Strain: Not on file   Food Insecurity: Not on file   Transportation Needs: Not on file   Physical Activity: Not on file   Stress: Not on file   Social Connections: Not on file   Intimate Partner Violence: Not on file   Housing Stability: Not on file                  Immunizations (includes history and patient reported):   There is no immunization history on file for this patient.        Allergies:  Diltiazem, Metronidazole, and Methylprednisone    Medications:  Medications Prior to Admission   Medication Sig   ? albuterol sulfate (PROAIR HFA) 90 mcg/actuation HFA aerosol inhaler Inhale two puffs by mouth into the lungs every 6 hours as needed for Wheezing.   ? atorvastatin (LIPITOR) 20 mg tablet Take one tablet by mouth daily.   ? bumetanide (BUMEX) 2 mg tablet Take one tablet by mouth twice daily. Take 2 tabs in the morning and 1 tab in the evening daily   ? carvedilol (COREG) 25 mg tablet Take 1 Tab by mouth twice daily.   ? dapagliflozin (FARXIGA) 10 mg tablet Take one tablet by mouth daily.   ? ELIQUIS 5 mg tablet Take 1 tablet by mouth twice daily.   ? glimepiride (AMARYL) 2 mg tablet Take 1 tablet by mouth daily.   ? ibuprofen (ADVIL) 200 mg PO tablet Take 200 mg by mouth every 6 hours as needed.   ? levothyroxine (SYNTHROID) 200 mcg tablet Take 1 tablet by mouth daily.   ? lisinopriL (ZESTRIL) 40 mg tablet TAKE ONE TABLET BY MOUTH EVERY DAY   ? metFORMIN-XR (GLUCOPHAGE XR) 500 mg extended release tablet Take 500 mg by mouth daily.   ? metOLazone (ZAROXOLYN) 2.5 mg tablet Take 1 tablet by mouth every 48 hours.   ? potassium chloride (K-TAB) 20 mEq tablet Take 20 mEq by mouth daily.   ? spironolactone (ALDACTONE) 25 mg tablet Take one tablet by mouth daily. Take with food.   ? traMADoL (ULTRAM) 50 mg tablet Take 1 tablet by mouth as Needed.       Review of Systems   Constitutional: Positive for weight gain. Negative for chills and fever.   Cardiovascular: Positive for dyspnea on exertion, leg swelling and orthopnea. Negative for chest pain.   Respiratory: Positive for shortness of breath and sleep disturbances due to breathing.    Musculoskeletal: Positive for muscle weakness.   Neurological: Positive for numbness (L hand).   All other systems reviewed and are negative.      Physical Exam  Vitals reviewed.   Constitutional:       General: She is not in acute distress.     Appearance: She is ill-appearing (chronically ill appearing). She is not toxic-appearing.   Eyes:      Extraocular Movements: Extraocular movements intact.   Cardiovascular:      Rate and Rhythm: Regular rhythm. Tachycardia present.   Pulmonary:      Effort: Respiratory distress (mild with conversation) present.   Abdominal:  Palpations: Abdomen is soft.   Musculoskeletal:      Cervical back: Normal range of motion.      Right lower leg: Edema present.      Left lower leg: Edema present.   Skin:     General: Skin is warm and dry.   Neurological:      Mental Status: She is alert and oriented to person, place, and time.      Motor: Weakness (slight R>L weakness of upper extremity hand and forearm) present.   Psychiatric:         Mood and Affect: Mood normal.         Behavior: Behavior normal.       Vital Signs: Last Filed In 24 Hours Vital Signs: 24 Hour Range   BP: 187/112 (04/20 1428)  Temp: 36.5 ?C (97.7 ?F) (04/20 1428)  Pulse: 111 (04/20 1428)  Respirations: 18 PER MINUTE (04/20 1428)  SpO2: 97 % (04/20 1428)  O2 Device: None (Room air) (04/20 1428)  Height: 165.1 cm (5' 5) (04/20 1052) BP: (160-187)/(110-112)   Temp:  [36.5 ?C (97.7 ?F)]   Pulse:  [111-115]   Respirations:  [18 PER MINUTE]   SpO2:  [97 %]   O2 Device: None (Room air)                Lab/Radiology/Other Diagnostic Tests:  24-hour labs:    Results for orders placed or performed during the hospital encounter of 08/30/20 (from the past 24 hour(s))   POC GLUCOSE    Collection Time: 08/30/20  2:35 PM   Result Value Ref Range    Glucose, POC 253 (H) 70 - 100 MG/DL     POC Glucose (Download): (!) 253 (08/30/20 1435)  Pertinent radiology reviewed.

## 2020-08-30 NOTE — Progress Notes
Patient arrived on unit via wheelchair accompanied by family. Patient transferred to the bed without assistance. Assessment completed, refer to flowsheet for details. Orders released, reviewed, and implemented as appropriate. Oriented to surroundings, call light within reach. Plan of care reviewed.  Will continue to monitor and assess.

## 2020-08-30 NOTE — Progress Notes
Date of Service: 08/30/2020    Kimberly Mitchell is a 80 y.o. female.       HPI     I had the pleasure of seeing Kimberly Mitchell for pre procedure history and physical prior to Mitraclip.  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 ?Other known medical problems includes history of breast cancer, diabetes mellitus, hypothyroidism, peripheral neuropathy, atrial fibrillation, and pulmonary emboli. ?She underwent recent echo Doppler in December 2021 which revealed a worsening of her ejection fraction down to 25 to 30%, moderate concentric LVH. ?She had a heavily calcified aortic valve with low gradient and moderate regurgitation. ?She was also noted to have severe mitral regurgitation with mitral annular calcification and severe tricuspid insufficiency with a pulmonary artery systolic pressure of 72 mmHg. ?She was referred to the valve clinic for further management of her valvular disease and is scheduled for [facility] clip on 09/01/20.  Right and left heart catheterization on 08/17/2020 revealed elevated right and left heart filling pressures, elevated mean pulmonary artery pressure and reduced cardiac indices by Fick and thermodilution.  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  The mean gradient across the mitral valve was 3.6 mmHg with a valve area of 1.16 cm?Marland Kitchen    She was evaluated by Dr. Pierre Bali on 08/08/2020 and was initiated on dapagliflozin.  Bumex was increased to 2 mg twice daily.  The patient today reports she misunderstood her post cath instructions and has been holding both Bumex as well as Eliquis since her heart catheterization.  She notes progressive shortness of breath even with minimal activity and notes increasing lower extremity edema over the last 3 weeks.  She awoke in the middle of the night on Easter with sudden shortness of breath and was taken to the [facility] emergency room.  It sounds like her O2 saturations were within normal limits however she required IV diuretics and was asked to follow-up closely with cardiology.  Her husband confirms that she has had progressive symptoms while holding Bumex.  She has not been weighing herself at home but she does note significant fluid retention.  She also describes orthopnea as well as PND.  She notes some intermittent chest pain with activity as well as with increased shortness of breath.  She denies palpitations but does note intermittent dizziness specifically when getting out of bed in the morning.         Vitals:    08/30/20 1052   BP: (!) 160/110   BP Source: Arm, Right Upper   Pulse: 115   PainSc: Zero   Weight: 72.1 kg (159 lb)   Height: 165.1 cm (5' 5)     Body mass index is 26.46 kg/m?Marland Kitchen     Past Medical History  Patient Active Problem List    Diagnosis Date Noted   ? Chronic systolic heart failure (HCC) 07/10/2020   ? Tricuspid regurgitation 07/10/2020   ? Nonrheumatic mitral valve regurgitation    ? Dyspnea      a. 07/2019 hospitalization for worsening  SOA @ [facility] [place]      ? Pulmonary hypertension (HCC)      a. 07/20/2019 - Echo - EF 55% / PAP 64 mmHg / moderate aortic stenosis, moderate aortic insufficiency/ mitral annular calcificaiton and moderate mitral regurgitation @ [place]     ?  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 Use of letrozole (Femara) 07/14/2017   ? 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 History of breast cancer 02/10/2014   ? Chest pain 11/26/2012   ? Carotid bruit 11/26/2012   ? 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 Aortic insufficiency 09/25/2008     Mild to moderate     ? Fibromyalgia 09/25/2008     Follows with a rheumatologist in [place] (based out of [facility])     ? Atrial septal aneurysm 09/25/2008   ? Hypothyroidism 09/25/2008   ? Hypertension 07/27/2006     02/06 renal ultrasound: Normal segmental arterial wave forms in both kidneys. No signs of renal  artery stenosis.     ? Diabetes mellitus (HCC) 07/27/2006   ? Hyperlipidemia 07/27/2006         Review of Systems   Constitutional: Negative.   HENT: Negative.    Eyes: Negative.    Cardiovascular: Positive for dyspnea on exertion.   Respiratory: Positive for shortness of breath.    Endocrine: Negative.    Hematologic/Lymphatic: Negative.    Skin: Negative.    Musculoskeletal: Negative.    Gastrointestinal: Negative.    Genitourinary: Negative.    Neurological: Negative.    Psychiatric/Behavioral: Negative.    All other systems reviewed and are negative.      Physical Exam  General Appearance: no acute distress  Skin: warm & intact  HEENT: unremarkable  Neck Veins: neck veins are distended  Chest Inspection: chest is normal in appearance  Auscultation/Percussion: lungs clear to auscultation diminished in bases  Cardiac Rhythm: regular rhythm & normal rate  Cardiac Auscultation: Normal S1 & S2, no S3 or S4, no rub  Murmurs: holosystolic murmur noted  Extremities: 1+  lower extremity edema to above the knee  Abdominal Exam: soft, non-tender, no masses, bowel sounds normal  Liver & Spleen: no organomegaly  Neurologic Exam: oriented to time, place and person; no focal neurologic deficits  Psychiatric: Normal mood and affect.  Behavior is normal. Judgment and thought content normal.         Cardiovascular Studies  Preliminary EKG: ST HR 115    Cardiovascular Health Factors  Vitals BP Readings from Last 3 Encounters:   08/30/20 (!) 160/110   08/17/20 (!) 149/80   08/08/20 (!) 168/88     Wt Readings from Last 3 Encounters:   08/30/20 72.1 kg (159 lb)   08/17/20 66.5 kg (146 lb 9.6 oz)   08/08/20 64.6 kg (142 lb 6.4 oz)     BMI Readings from Last 3 Encounters:   08/30/20 26.46 kg/m? 08/17/20 24.40 kg/m?   08/08/20 23.70 kg/m?      Smoking Social History     Tobacco Use   Smoking Status Never Smoker   Smokeless Tobacco Never Used      Lipid Profile Cholesterol   Date Value Ref Range Status   08/17/2020 180 <200 MG/DL Final     HDL   Date Value Ref Range Status   08/17/2020 66 >40 MG/DL Final     LDL   Date Value Ref Range Status   08/17/2020 85 <100 mg/dL Final     Triglycerides   Date Value Ref Range Status   08/17/2020 86 <150 MG/DL Final      Blood Sugar Hemoglobin A1C   Date Value Ref Range Status   07/27/2006  7.8 (H) 5.0 - 6.5 % Final     Comment:     For non-diabetic patients, the normal reference range is 5.0-6.5%.  For patients with Type I or Type II Diabetes Mellitus, the ADA recommends   maintaining the A1c level <7.3%.  However, at levels below 5.8%, the risk of   hypoglycemia increases.  Patients with an A1c of >9.8% are considered to be   extremely hyperglycemic.  Please note the reference range is higher by 0.3%.  This is due to the new world   standardizing organizations making diabetic monitoring the same   internationally.     Glucose   Date Value Ref Range Status   08/30/2020 231 (H) 70 - 100 MG/DL Final   29/56/2130 865 (H) 70 - 100 MG/DL Final   78/46/9629 528 (H) 70 - 100 MG/DL Final     Glucose, POC   Date Value Ref Range Status   07/27/2006 192 (H) 70 - 110 MG/DL Final   41/32/4401 027 (H) 70 - 110 MG/DL Final   25/36/6440 347 (H) 70 - 110 MG/DL Final          Problems Addressed Today  Encounter Diagnoses   Name Primary?   ? Nonrheumatic mitral valve regurgitation Yes   ? Mixed hyperlipidemia    ? Primary hypertension    ? Chronic systolic heart failure (HCC)    ? Type 2 diabetes mellitus without complication, without long-term current use of insulin (HCC)    ? Tricuspid valve insufficiency, unspecified etiology        Assessment and Plan     Valvular disease  Mitral regurgitation  Aortic regurgitation  Tricuspid regurgitation  She has evidence of moderate to severe mitral regurgitation as well as severe tricuspid regurgitation.  She is scheduled for MitraClip on Friday however she has been erroneously holding her diuretics and she appears moderately decompensated today on exam with moderate volume overload.  She was evaluated in the emergency room on Easter for rapid onset shortness of breath overnight while sleeping and was given IV diuretics.  She needs to be admitted to the hospital for optimization and diuresis prior to planned MitraClip.  We will plan to follow along during her hospitalization and determine if MitraClip can be safely performed on Friday as scheduled.    Chronic?combined systolic and diastolic heart failure  As above she will be admitted to the hospital due to acute decompensated heart failure and will require IV diuretics.  She will be admitted to Dr. Sherrye Payor team.    Mixed hyperlipidemia  ?  Primary hypertension  Her blood pressure has been difficult to control. ?She is moderately elevated today in clinic however reports she is not taken her a.m. medications.  ?  Pulmonary hypertension  ?  We appreciate the opportunity to participate in her care.  ?  Dorena Cookey, APRN  Pager [phone]         Current Medications (including today's revisions)  ? albuterol sulfate (PROAIR HFA) 90 mcg/actuation HFA aerosol inhaler Inhale two puffs by mouth into the lungs every 6 hours as needed for Wheezing.   ? atorvastatin (LIPITOR) 20 mg tablet Take one tablet by mouth daily.   ? bumetanide (BUMEX) 2 mg tablet Take one tablet by mouth twice daily. Take 2 tabs in the morning and 1 tab in the evening daily   ? carvedilol (COREG) 25 mg tablet Take 1 Tab by mouth twice daily.   ? dapagliflozin (FARXIGA) 10 mg tablet Take one  tablet by mouth daily.   ? ELIQUIS 5 mg tablet Take 1 tablet by mouth twice daily.   ? glimepiride (AMARYL) 2 mg tablet Take 1 tablet by mouth daily.   ? ibuprofen (ADVIL) 200 mg PO tablet Take 200 mg by mouth every 6 hours as needed.   ? levothyroxine (SYNTHROID) 200 mcg tablet Take 1 tablet by mouth daily.   ? lisinopriL (ZESTRIL) 40 mg tablet TAKE ONE TABLET BY MOUTH EVERY DAY   ? metFORMIN-XR (GLUCOPHAGE XR) 500 mg extended release tablet Take 500 mg by mouth daily.   ? metOLazone (ZAROXOLYN) 2.5 mg tablet Take 1 tablet by mouth every 48 hours.   ? potassium chloride (K-TAB) 20 mEq tablet Take 20 mEq by mouth daily.   ? spironolactone (ALDACTONE) 25 mg tablet Take one tablet by mouth daily. Take with food.   ? traMADoL (ULTRAM) 50 mg tablet Take 1 tablet by mouth as Needed.

## 2020-08-30 NOTE — Case Management (ED)
Case Management Admission Assessment    NAME:Kimberly Mitchell                          MRN: [id number]             DOB:11-14-1940          AGE: 80 y.o.  ADMISSION DATE: 08/30/2020             DAYS ADMITTED: LOS: 0 days      Today?s Date: 08/30/2020    Source of Information: EMR review and patient    - This CM met with patient for assessment on this date.  Provided contact information and explanation of SW/NCM roles. Provided opportunity for questions and discussion. Pt/family encouraged to contact Case Management team with questions and concerns during hospitalization and until patient is able to transition back to the patient's primary care physician.  - NCM reviewed EMR.  - NCM attended and participated in Cardiology huddle.  - Introduced self and explained NCM role.  - Demographics verified with patient.  - Patient lives in a two story home with her husband, Casimiro Needle, with 12 STE. Patient was independent with ADLs.   - Patient's PCP is Dr. Maisie Fus.  - Patient's primary insurance is BCBS, secondary is [employer], and prescriptions are through [employer]. Prescriptions are affordable and accessible.  - HH: [facility] - yes patient would use again  - OP OT: [facility] - yes patient would use again  - Patient's husband, Casimiro Needle [phone]), will provide transportation home on day of discharge.  - CM team will continue to provide support and assist with discharge planning.            Plan  Plan: Case Management Assessment, Assist PRN with SW/NCM Services    Patient Address/Phone  [address]  [place] [address]  [phone] (home)     Emergency Contact  Extended Emergency Contact Information  Primary Emergency Contact: Faulstich,Michael E,Sr.   [employer] Phone: [phone]  Mobile Phone: [phone]  Relation: Spouse  Secondary Emergency Contact: Verta Ellen States  Home Phone: [phone]  Mobile Phone: [phone]  Relation: Son    Event organiser  Does the Patient Need Case Management to Arrange Discharge Transport? (ex: facility, ambulance, wheelchair/stretcher, Medicaid, cab, other): No  Will the Patient Use Family Transport?: Yes  Transportation Name, Phone and Availability #1: Casimiro Needle, husband, [phone]    Expected Discharge Date  09/01/2020 12:00 PM    Living Situation Prior to Admission  ? Living Arrangements  Type of Residence: Home, independent  Living Arrangements: Spouse/significant other Casimiro Needle, husband)  Financial risk analyst / Tub: Psychologist, counselling, Tub Only  How many levels in the residence?: 2  Can patient live on one level if needed?: Yes  Does residence have entry and/or side stairs?: Yes (12 STE)  Assistance needed prior to admit or anticipated on discharge: No  Who provides assistance or could if needed?: Family in the area  Are they in good health?: Yes  Can support system provide 24/7 care if needed?: Yes  ? Level of Function   Prior level of function: Independent  ? Cognitive Abilities   Cognitive Abilities: Alert and Oriented, Engages in problem solving and planning, Participates in decision making, Recognizes impact of health condition on lifestyle    Financial Resources  ? Coverage  Primary Insurance: Radio broadcast assistant)  Secondary Insurance: Medicare Supplement Administrator Medicare)  Additional Coverage:  RX (BCBS)    ? Source of Income   Source Of Income: SSI, Other retirement income  ? Financial Assistance Needed?  Denies financial assistance needs at this time.       Psychosocial Needs  ? Mental Health  Mental Health History: Yes (anxiety)  ? Substance Use History  Substance Use History Screen: No  ? Other      Current/Previous Services  ? PCP  Erskine Emery, [phone], [phone]  ? Pharmacy    [facility] [address], [place] - [address]  [place] [address]  Phone: [phone] Fax: [phone]    [facility] - [place], [place] - [address]  [address]  [place] [address]  Phone: [phone] Fax: [phone]    ? Durable Medical Equipment   Durable Medical Equipment at home: None  ? Home Health  Receiving home health: In the past  Agency name: [facility]  Would patient use this agency again?: Yes  ? Hemodialysis or Peritoneal Dialysis  Undergoing hemodialysis or peritoneal dialysis: No  ? Tube/Enteral Feeds  Receive tube/enteral feeds: No  ? Infusion  Receive infusions: No  ? Private Duty  Private duty help used: No  ? Home and Community Based Services  Home and community based services: No  ? Ryan White  Ryan White: No  ? Hospice  Hospice: No  ? Outpatient Therapy  PT: No  OT: In the past  Name of rehab location/group: [facility]  Would patient return for future services?: Yes  SLP: No  ? Skilled Nursing Facility/Nursing Home  SNF: No  NH: No  ? Inpatient Rehab  IPR: No  ? Long-Term Acute Care Hospital  LTACH: No  ? Acute Hospital Stay  Acute Hospital Stay: In the past  Was patient's stay within the last 30 days?: No    Ellender Hose, BSN, Conservation officer, nature   Available on [employer]

## 2020-08-30 NOTE — Care Plan
Problem: Discharge Planning  Goal: Participation in plan of care  Outcome: Goal Ongoing  Goal: Knowledge regarding plan of care  Outcome: Goal Ongoing  Goal: Prepared for discharge  Outcome: Goal Ongoing

## 2020-08-31 ENCOUNTER — Encounter: Admit: 2020-08-31 | Discharge: 2020-08-31 | Payer: BC Managed Care – PPO

## 2020-08-31 ENCOUNTER — Inpatient Hospital Stay: Admit: 2020-08-31 | Discharge: 2020-08-31 | Payer: BC Managed Care – PPO

## 2020-08-31 MED ADMIN — POTASSIUM CHLORIDE 20 MEQ PO TBTQ [35943]: 60 meq | ORAL | @ 14:00:00 | Stop: 2020-08-31 | NDC 00904708661

## 2020-08-31 MED ADMIN — BUMETANIDE 0.25 MG/ML IJ SOLN [9308]: 4 mg | INTRAVENOUS | @ 20:00:00 | NDC 00409141234

## 2020-08-31 MED ADMIN — MAGNESIUM SULFATE IN D5W 1 GRAM/100 ML IV PGBK [166578]: 1 g | INTRAVENOUS | @ 14:00:00 | Stop: 2020-08-31 | NDC 00338170940

## 2020-08-31 MED ADMIN — MAGNESIUM SULFATE IN D5W 1 GRAM/100 ML IV PGBK [166578]: 1 g | INTRAVENOUS | @ 15:00:00 | Stop: 2020-08-31 | NDC 00338170940

## 2020-08-31 MED ADMIN — SPIRONOLACTONE 25 MG PO TAB [7437]: 25 mg | ORAL | @ 18:00:00 | Stop: 2020-08-31 | NDC 63739054410

## 2020-08-31 NOTE — Progress Notes
Mitraclip Evaluation    Date of initial eval: 2/28  Name: Kimberly Mitchell  DOB: [birth year]  MRN: [id number]  Primary provider: Erskine Emery  Referring cardiologist: Geronimo Boot    Cardiac Surgeon eval: ED  Cardiologist eval: PT    Assessment & Plan:   Severe symptomatic functional mitral regurgitation. Mitraclip work up complete and results reviewed.  Mitraclip scheduled 4/22 with Dr. Mackey Birchwood and Dr. Elias Else. She was admitted from clinic prior to procedure due to decompensated heart failure.      Janace Litten, APRN-NP    Anticoagulation/Antiplatelet plan: aspirin, apixaban    Pertinent Medical History:   LVH, pulm HTN, HTN, MR, AI, TR.  HFrEF - 25-30%    Acute on chronic combined systolic and diastolic Heart failure, NYHA class III    BMI: 26.46  Allergies:   Allergies   Allergen Reactions   ? Diltiazem EDEMA   ? Metronidazole ITCHING and SEE COMMENTS     Low heart rate   ? Methylprednisone UNKNOWN       STS:  Risk of mortality MV replacement: 4.901%  Risk of mortality MV repair: 4.688%    KCCQ: 12.5    Lab: Hgb 10.9, BNP 4609    EKG: sinus tachycardia,     Echo:   1. The left ventricle is dilated and spherical in shape.  Moderate concentric hypertrophy.  Severely reduced systolic function. Estimated EF 25-30%.   2. Normal right ventricular size.  Mildly reduced systolic function.  3. Biatrial dilatation.  4. The aortic valve is heavily calcified.  At least mild stenosis.  Likely low-flow low gradient (SVI 25 mL/m?).  Moderate regurgitation.  5. Moderate calcification of the mitral valve annulus.  No stenosis.  Severe regurgitation.  6. 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  7. Trivial pericardial effusion.  8. Estimated pulmonary artery systolic pressure 72 mmHg.   9. 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    TEE:  10. Moderate-severe, eccentric mitral regurgitation likely due to decreased leaflet coaptation. Systolic blunting of pulmonary venous inflow.  11. Dilated left ventricle with severely reduced systolic function. Estimated ejection fraction is 25%.   12. Right ventricle is normal-sized with reduced function.  13. Severe, central tricuspid regurgitation. Hepatic vein systolic flow reversal.  14. Sclerotic aortic valve with reduced leaflet excursion. Planimetry of the aortic valve measures 1.1 cm^2 in the setting of reduced systolic function.  15. No intracardiac thrombus or mass.  16. Aneurysmal interatrial septum with rightward bowing due to elevated left atrial pressures. No evidence of interatrial shunting by color doppler.  17. Small pericardial effusion.    Cath:   1. Elevated right and left-sided filling pressures.  2. Elevated mean pulmonary artery pressure.  3. Reduced cardiac indices by Fick and thermodilution method.  4. Elevated systemic vascular resistance.  5. Elevated pulmonary vascular resistance.  6. Moderate nonobstructive coronary artery disease involving the left anterior descending artery.  7. Elevated left ventricular end-diastolic pressure.  8. No significant gradient across the aortic valve on pullback.  9. The mean gradient across the mitral valve was 3.6 mmHg, with a valve area of 1.16 sq cm.    PFTs: not completed due to hospital admission for decompensated HF  FEV1:  DLCO:

## 2020-08-31 NOTE — Telephone Encounter
Left voicemail requesting patient return call to discuss lab result.

## 2020-09-01 ENCOUNTER — Encounter: Admit: 2020-09-01 | Discharge: 2020-09-01 | Payer: BC Managed Care – PPO

## 2020-09-01 ENCOUNTER — Inpatient Hospital Stay: Admit: 2020-09-01 | Discharge: 2020-09-01 | Payer: BC Managed Care – PPO

## 2020-09-01 DIAGNOSIS — I340 Nonrheumatic mitral (valve) insufficiency: Secondary | ICD-10-CM

## 2020-09-01 MED ORDER — EPINEPHRINE 0.1MG NS 10ML SYR (AM)(OR)
INTRAVENOUS | 0 refills | Status: DC
Start: 2020-09-01 — End: 2020-09-01
  Administered 2020-09-01 (×2): 10 ug via INTRAVENOUS

## 2020-09-01 MED ORDER — LIDOCAINE (PF) 200 MG/10 ML (2 %) IJ SYRG
INTRAVENOUS | 0 refills | Status: DC
Start: 2020-09-01 — End: 2020-09-01
  Administered 2020-09-01: 15:00:00 60 mg via INTRAVENOUS

## 2020-09-01 MED ORDER — ONDANSETRON HCL (PF) 4 MG/2 ML IJ SOLN
INTRAVENOUS | 0 refills | Status: DC
Start: 2020-09-01 — End: 2020-09-01
  Administered 2020-09-01: 17:00:00 4 mg via INTRAVENOUS

## 2020-09-01 MED ORDER — DOBUTAMINE IN D5W 1,000 MG/250 ML (4,000 MCG/ML) IV SOLP (INFUSION)(AM)(OR)
INTRAVENOUS | 0 refills | Status: DC
Start: 2020-09-01 — End: 2020-09-01
  Administered 2020-09-01: 15:00:00 5 ug/kg/min via INTRAVENOUS

## 2020-09-01 MED ORDER — FENTANYL CITRATE (PF) 50 MCG/ML IJ SOLN
INTRAVENOUS | 0 refills | Status: DC
Start: 2020-09-01 — End: 2020-09-01
  Administered 2020-09-01: 15:00:00 50 ug via INTRAVENOUS

## 2020-09-01 MED ORDER — CEFAZOLIN 1 GRAM IJ SOLR
INTRAVENOUS | 0 refills | Status: DC
Start: 2020-09-01 — End: 2020-09-01
  Administered 2020-09-01: 15:00:00 2 g via INTRAVENOUS

## 2020-09-01 MED ORDER — SUGAMMADEX 100 MG/ML IV SOLN
INTRAVENOUS | 0 refills | Status: DC
Start: 2020-09-01 — End: 2020-09-01
  Administered 2020-09-01: 17:00:00 130 mg via INTRAVENOUS

## 2020-09-01 MED ORDER — ARTIFICIAL TEARS SINGLE DOSE DROPS GROUP
OPHTHALMIC | 0 refills | Status: DC
Start: 2020-09-01 — End: 2020-09-01
  Administered 2020-09-01: 15:00:00 1 [drp] via OPHTHALMIC

## 2020-09-01 MED ORDER — PHENYLEPHRINE 40 MCG/ML IN NS IV DRIP (STD CONC)
INTRAVENOUS | 0 refills | Status: DC
Start: 2020-09-01 — End: 2020-09-01
  Administered 2020-09-01 (×2): .5 ug/kg/min via INTRAVENOUS

## 2020-09-01 MED ORDER — PROTAMINE 10 MG/ML IV SOLN
INTRAVENOUS | 0 refills | Status: DC
Start: 2020-09-01 — End: 2020-09-01
  Administered 2020-09-01 (×4): 10 mg via INTRAVENOUS

## 2020-09-01 MED ORDER — VECURONIUM BROMIDE 10 MG IV SOLR
INTRAVENOUS | 0 refills | Status: DC
Start: 2020-09-01 — End: 2020-09-01
  Administered 2020-09-01: 15:00:00 5 mg via INTRAVENOUS

## 2020-09-01 MED ORDER — HEPARIN (PORCINE) 1,000 UNIT/ML IJ SOLN
INTRAVENOUS | 0 refills | Status: DC
Start: 2020-09-01 — End: 2020-09-01
  Administered 2020-09-01: 15:00:00 2000 [IU] via INTRAVENOUS
  Administered 2020-09-01: 16:00:00 4000 [IU] via INTRAVENOUS
  Administered 2020-09-01: 16:00:00 1000 [IU] via INTRAVENOUS

## 2020-09-01 MED ORDER — PROPOFOL INJ 10 MG/ML IV VIAL
INTRAVENOUS | 0 refills | Status: DC
Start: 2020-09-01 — End: 2020-09-01
  Administered 2020-09-01 (×2): 10 mg via INTRAVENOUS

## 2020-09-01 MED ADMIN — SODIUM CHLORIDE 0.9 % IV SOLP [27838]: 1000.000 mL | INTRAVENOUS | @ 17:00:00 | Stop: 2020-09-02 | NDC 00338004904

## 2020-09-01 MED ADMIN — CEFAZOLIN INJ 1GM IVP [210319]: 1 g | INTRAVENOUS | @ 23:00:00 | Stop: 2020-09-02 | NDC 60505614200

## 2020-09-01 MED ADMIN — BUMETANIDE 0.25 MG/ML IJ SOLN [9308]: 2 mg | INTRAVENOUS | @ 23:00:00 | NDC 00409141234

## 2020-09-01 MED ADMIN — SODIUM CHLORIDE 0.9 % IV SOLP [27838]: 1000.000 mL | INTRAVENOUS | @ 14:00:00 | Stop: 2020-09-01 | NDC 00338004904

## 2020-09-01 MED ADMIN — POTASSIUM CHLORIDE 20 MEQ PO TBTQ [35943]: 60 meq | ORAL | @ 03:00:00 | Stop: 2020-09-01 | NDC 00904708661

## 2020-09-01 MED ADMIN — MAGNESIUM SULFATE IN D5W 1 GRAM/100 ML IV PGBK [166578]: 1 g | INTRAVENOUS | @ 03:00:00 | Stop: 2020-09-01 | NDC 00338170940

## 2020-09-01 MED ADMIN — MAGNESIUM OXIDE 400 MG (241.3 MG MAGNESIUM) PO TAB [10491]: 600 mg | ORAL | @ 19:00:00 | Stop: 2020-09-16 | NDC 10006070028

## 2020-09-01 MED ADMIN — ASPIRIN 81 MG PO CHEW [680]: 81 mg | ORAL | @ 14:00:00 | Stop: 2020-09-01 | NDC 66553000201

## 2020-09-01 MED ADMIN — INSULIN ASPART 100 UNIT/ML SC FLEXPEN [87504]: 2 [IU] | SUBCUTANEOUS | @ 19:00:00 | NDC 00169633910

## 2020-09-01 MED ADMIN — BUMETANIDE 0.25 MG/ML IJ SOLN [9308]: 4 mg | INTRAVENOUS | @ 03:00:00 | NDC 00641600801

## 2020-09-01 MED ADMIN — POTASSIUM CHLORIDE 20 MEQ PO TBTQ [35943]: 40 meq | ORAL | @ 19:00:00 | Stop: 2021-09-01 | NDC 00904708661

## 2020-09-01 MED ADMIN — POTASSIUM CHLORIDE 20 MEQ PO TBTQ [35943]: 40 meq | ORAL | @ 23:00:00 | Stop: 2021-09-01 | NDC 00245531989

## 2020-09-01 MED ADMIN — FUROSEMIDE 10 MG/ML IJ SOLN [3291]: 40 mg | INTRAVENOUS | @ 17:00:00 | Stop: 2020-09-01 | NDC 70860030242

## 2020-09-02 ENCOUNTER — Inpatient Hospital Stay: Admit: 2020-09-02 | Discharge: 2020-09-02 | Payer: BC Managed Care – PPO

## 2020-09-02 MED ADMIN — MAGNESIUM OXIDE 400 MG (241.3 MG MAGNESIUM) PO TAB [10491]: 400 mg | ORAL | @ 17:00:00 | Stop: 2020-09-16 | NDC 10006070028

## 2020-09-02 MED ADMIN — SPIRONOLACTONE 25 MG PO TAB [7437]: 25 mg | ORAL | @ 15:00:00 | NDC 63739054410

## 2020-09-02 MED ADMIN — PERFLUTREN LIPID MICROSPHERES 1.1 MG/ML IV SUSP [79178]: 2 mL | INTRAVENOUS | @ 16:00:00 | Stop: 2020-09-02 | NDC 11994001116

## 2020-09-02 MED ADMIN — SENNOSIDES-DOCUSATE SODIUM 8.6-50 MG PO TAB [40926]: 2 | ORAL | @ 15:00:00 | NDC 57896055510

## 2020-09-02 MED ADMIN — CEFAZOLIN INJ 1GM IVP [210319]: 1 g | INTRAVENOUS | @ 15:00:00 | Stop: 2020-09-02 | NDC 60505614200

## 2020-09-02 MED ADMIN — ONDANSETRON HCL (PF) 4 MG/2 ML IJ SOLN [136012]: 4 mg | INTRAVENOUS | @ 18:00:00 | NDC 36000001225

## 2020-09-02 MED ADMIN — APIXABAN 5 MG PO TAB [315778]: 5 mg | ORAL | @ 17:00:00 | NDC 00003089431

## 2020-09-02 MED ADMIN — BUMETANIDE 0.25 MG/ML IJ SOLN [9308]: 2 mg | INTRAVENOUS | @ 23:00:00 | NDC 00641600801

## 2020-09-02 MED ADMIN — METFORMIN 500 MG PO TAB [10544]: 500 mg | ORAL | @ 15:00:00 | NDC 00904716261

## 2020-09-02 MED ADMIN — BUMETANIDE 0.25 MG/ML IJ SOLN [9308]: 2 mg | INTRAVENOUS | @ 14:00:00 | NDC 00641600801

## 2020-09-02 MED ADMIN — INSULIN ASPART 100 UNIT/ML SC FLEXPEN [87504]: 2 [IU] | SUBCUTANEOUS | @ 03:00:00 | NDC 00169633910

## 2020-09-02 MED ADMIN — GLIMEPIRIDE 2 MG PO TAB [16356]: 2 mg | ORAL | @ 15:00:00 | NDC 68084032611

## 2020-09-02 MED ADMIN — WATER FOR INJECTION, STERILE IJ SOLN [79513]: 10 mL | INTRAVENOUS | @ 15:00:00 | Stop: 2020-09-02 | NDC 00409488717

## 2020-09-02 MED ADMIN — CEFAZOLIN INJ 1GM IVP [210319]: 1 g | INTRAVENOUS | @ 06:00:00 | Stop: 2020-09-02 | NDC 60505614200

## 2020-09-02 MED ADMIN — POTASSIUM CHLORIDE 20 MEQ PO TBTQ [35943]: 40 meq | ORAL | @ 15:00:00 | Stop: 2021-09-01 | NDC 00245531989

## 2020-09-02 MED ADMIN — ASPIRIN 81 MG PO CHEW [680]: 81 mg | ORAL | @ 15:00:00 | NDC 66553000201

## 2020-09-02 MED ADMIN — LOSARTAN 50 MG PO TAB [76938]: 100 mg | ORAL | @ 15:00:00 | NDC 68084034711

## 2020-09-03 ENCOUNTER — Inpatient Hospital Stay: Admit: 2020-09-03 | Discharge: 2020-09-03 | Payer: BC Managed Care – PPO

## 2020-09-03 MED ADMIN — METFORMIN 500 MG PO TAB [10544]: 500 mg | ORAL | @ 14:00:00 | NDC 00904716261

## 2020-09-03 MED ADMIN — SIMETHICONE 80 MG PO CHEW [7227]: 160 mg | ORAL | @ 19:00:00 | NDC 70000043401

## 2020-09-03 MED ADMIN — APIXABAN 5 MG PO TAB [315778]: 5 mg | ORAL | @ 14:00:00 | Stop: 2020-09-03 | NDC 00003089431

## 2020-09-03 MED ADMIN — LACTULOSE 10 GRAM/15 ML PO LIQUID GROUP [280001]: 20 g | ORAL | @ 19:00:00 | NDC 00121115430

## 2020-09-03 MED ADMIN — LOSARTAN 50 MG PO TAB [76938]: 100 mg | ORAL | @ 14:00:00 | NDC 68084034711

## 2020-09-03 MED ADMIN — SENNOSIDES-DOCUSATE SODIUM 8.6-50 MG PO TAB [40926]: 2 | ORAL | @ 02:00:00 | NDC 57896055510

## 2020-09-03 MED ADMIN — GLIMEPIRIDE 2 MG PO TAB [16356]: 2 mg | ORAL | @ 14:00:00 | NDC 68084032611

## 2020-09-03 MED ADMIN — LACTULOSE 10 GRAM/15 ML PO LIQUID GROUP [280001]: 20 g | ORAL | @ 14:00:00 | NDC 00121115430

## 2020-09-03 MED ADMIN — MAGNESIUM OXIDE 400 MG (241.3 MG MAGNESIUM) PO TAB [10491]: 800 mg | ORAL | @ 17:00:00 | NDC 10006070028

## 2020-09-03 MED ADMIN — ACETAMINOPHEN 325 MG PO TAB [101]: 650 mg | ORAL | @ 20:00:00 | NDC 00904677361

## 2020-09-03 MED ADMIN — BUMETANIDE 1 MG PO TAB [9310]: 2 mg | ORAL | @ 14:00:00 | NDC 50268013111

## 2020-09-03 MED ADMIN — SIMETHICONE 80 MG PO CHEW [7227]: 160 mg | ORAL | @ 14:00:00 | NDC 70000043401

## 2020-09-03 MED ADMIN — SENNOSIDES-DOCUSATE SODIUM 8.6-50 MG PO TAB [40926]: 2 | ORAL | @ 14:00:00 | NDC 57896055510

## 2020-09-03 MED ADMIN — MAGNESIUM OXIDE 400 MG (241.3 MG MAGNESIUM) PO TAB [10491]: 800 mg | ORAL | @ 14:00:00 | NDC 10006070028

## 2020-09-03 MED ADMIN — ASPIRIN 81 MG PO CHEW [680]: 81 mg | ORAL | @ 14:00:00 | NDC 66553000201

## 2020-09-03 MED ADMIN — BUMETANIDE 1 MG PO TAB [9310]: 2 mg | ORAL | @ 23:00:00 | NDC 50268013111

## 2020-09-03 MED ADMIN — APIXABAN 5 MG PO TAB [315778]: 5 mg | ORAL | @ 02:00:00 | NDC 00003089431

## 2020-09-03 MED ADMIN — SPIRONOLACTONE 25 MG PO TAB [7437]: 25 mg | ORAL | @ 14:00:00 | NDC 63739054410

## 2020-09-03 MED ADMIN — MAGNESIUM OXIDE 400 MG (241.3 MG MAGNESIUM) PO TAB [10491]: 800 mg | ORAL | @ 23:00:00 | NDC 10006070028

## 2020-09-04 ENCOUNTER — Encounter: Admit: 2020-09-04 | Discharge: 2020-09-04 | Payer: BC Managed Care – PPO

## 2020-09-04 ENCOUNTER — Inpatient Hospital Stay: Admit: 2020-09-04 | Discharge: 2020-09-04 | Payer: BC Managed Care – PPO

## 2020-09-04 DIAGNOSIS — Z9889 Other specified postprocedural states: Secondary | ICD-10-CM

## 2020-09-04 DIAGNOSIS — I10 Essential (primary) hypertension: Secondary | ICD-10-CM

## 2020-09-04 MED ADMIN — MAGNESIUM OXIDE 400 MG (241.3 MG MAGNESIUM) PO TAB [10491]: 800 mg | ORAL | @ 23:00:00 | NDC 10006070028

## 2020-09-04 MED ADMIN — BUMETANIDE 1 MG PO TAB [9310]: 2 mg | ORAL | @ 13:00:00 | NDC 50268013111

## 2020-09-04 MED ADMIN — SIMETHICONE 80 MG PO CHEW [7227]: 160 mg | ORAL | @ 02:00:00 | NDC 70000043401

## 2020-09-04 MED ADMIN — ACETAMINOPHEN 325 MG PO TAB [101]: 650 mg | ORAL | @ 12:00:00 | NDC 00904677361

## 2020-09-04 MED ADMIN — LOSARTAN 50 MG PO TAB [76938]: 100 mg | ORAL | @ 13:00:00 | NDC 68084034711

## 2020-09-04 MED ADMIN — METFORMIN 500 MG PO TAB [10544]: 500 mg | ORAL | @ 13:00:00 | NDC 00904716261

## 2020-09-04 MED ADMIN — GLIMEPIRIDE 2 MG PO TAB [16356]: 2 mg | ORAL | @ 13:00:00 | NDC 68084032611

## 2020-09-04 MED ADMIN — SPIRONOLACTONE 25 MG PO TAB [7437]: 25 mg | ORAL | @ 13:00:00 | Stop: 2020-09-04 | NDC 63739054410

## 2020-09-04 MED ADMIN — MAGNESIUM OXIDE 400 MG (241.3 MG MAGNESIUM) PO TAB [10491]: 800 mg | ORAL | @ 17:00:00 | NDC 10006070028

## 2020-09-04 MED ADMIN — MAGNESIUM OXIDE 400 MG (241.3 MG MAGNESIUM) PO TAB [10491]: 800 mg | ORAL | @ 13:00:00 | NDC 10006070028

## 2020-09-04 MED ADMIN — MAGNESIUM OXIDE 400 MG (241.3 MG MAGNESIUM) PO TAB [10491]: 200 mg | ORAL | @ 12:00:00 | Stop: 2020-09-16 | NDC 10006070028

## 2020-09-04 MED ADMIN — METOLAZONE 2.5 MG PO TAB [10587]: 2.5 mg | ORAL | @ 18:00:00 | NDC 00185505001

## 2020-09-04 MED ADMIN — ASPIRIN 81 MG PO CHEW [680]: 81 mg | ORAL | @ 13:00:00 | NDC 66553000201

## 2020-09-04 MED ADMIN — POTASSIUM CHLORIDE 20 MEQ PO TBTQ [35943]: 40 meq | ORAL | @ 12:00:00 | Stop: 2021-09-01 | NDC 00245531989

## 2020-09-04 MED ADMIN — ACETAMINOPHEN 325 MG PO TAB [101]: 650 mg | ORAL | @ 02:00:00 | NDC 00904677361

## 2020-09-04 MED ADMIN — SIMETHICONE 80 MG PO CHEW [7227]: 160 mg | ORAL | @ 13:00:00 | NDC 70000043401

## 2020-09-04 MED ADMIN — POTASSIUM CHLORIDE 20 MEQ PO TBTQ [35943]: 40 meq | ORAL | @ 17:00:00 | Stop: 2021-09-01 | NDC 00245531989

## 2020-09-04 MED ADMIN — BUMETANIDE 1 MG PO TAB [9310]: 2 mg | ORAL | @ 23:00:00 | NDC 50268013111

## 2020-09-05 ENCOUNTER — Inpatient Hospital Stay: Admit: 2020-09-05 | Discharge: 2020-09-05 | Payer: BC Managed Care – PPO

## 2020-09-05 MED ADMIN — ONDANSETRON HCL (PF) 4 MG/2 ML IJ SOLN [136012]: 4 mg | INTRAVENOUS | @ 15:00:00 | NDC 36000001225

## 2020-09-05 MED ADMIN — LOSARTAN 50 MG PO TAB [76938]: 100 mg | ORAL | @ 14:00:00 | NDC 68084034711

## 2020-09-05 MED ADMIN — BUMETANIDE 1 MG PO TAB [9310]: 2 mg | ORAL | @ 14:00:00 | NDC 50268013111

## 2020-09-05 MED ADMIN — GLIMEPIRIDE 2 MG PO TAB [16356]: 2 mg | ORAL | @ 14:00:00 | NDC 68084032611

## 2020-09-05 MED ADMIN — ACETAMINOPHEN 325 MG PO TAB [101]: 650 mg | ORAL | @ 03:00:00 | NDC 00904677361

## 2020-09-05 MED ADMIN — HEPARIN, PORCINE (PF) 5,000 UNIT/0.5 ML IJ SYRG [95535]: 5000 [IU] | SUBCUTANEOUS | @ 10:00:00 | Stop: 2020-09-05 | NDC 00409131611

## 2020-09-05 MED ADMIN — MAGNESIUM OXIDE 400 MG (241.3 MG MAGNESIUM) PO TAB [10491]: 800 mg | ORAL | @ 17:00:00 | NDC 10006070028

## 2020-09-05 MED ADMIN — MAGNESIUM OXIDE 400 MG (241.3 MG MAGNESIUM) PO TAB [10491]: 800 mg | ORAL | @ 23:00:00 | NDC 10006070028

## 2020-09-05 MED ADMIN — ASPIRIN 81 MG PO CHEW [680]: 81 mg | ORAL | @ 14:00:00 | NDC 66553000201

## 2020-09-05 MED ADMIN — SPIRONOLACTONE 25 MG PO TAB [7437]: 25 mg | ORAL | @ 14:00:00 | NDC 63739054410

## 2020-09-05 MED ADMIN — POTASSIUM CHLORIDE 20 MEQ PO TBTQ [35943]: 40 meq | ORAL | @ 10:00:00 | Stop: 2021-09-01 | NDC 00245531989

## 2020-09-05 MED ADMIN — BUMETANIDE 1 MG PO TAB [9310]: 2 mg | ORAL | @ 23:00:00 | NDC 50268013111

## 2020-09-05 MED ADMIN — HEPARIN, PORCINE (PF) 5,000 UNIT/0.5 ML IJ SYRG [95535]: 5000 [IU] | SUBCUTANEOUS | @ 03:00:00 | NDC 00409131611

## 2020-09-05 MED ADMIN — SIMETHICONE 80 MG PO CHEW [7227]: 160 mg | ORAL | @ 14:00:00 | NDC 70000043401

## 2020-09-05 MED ADMIN — METFORMIN 500 MG PO TAB [10544]: 500 mg | ORAL | @ 14:00:00 | NDC 00904716261

## 2020-09-05 MED ADMIN — MAGNESIUM OXIDE 400 MG (241.3 MG MAGNESIUM) PO TAB [10491]: 800 mg | ORAL | @ 14:00:00 | NDC 10006070028

## 2020-09-06 ENCOUNTER — Inpatient Hospital Stay: Admit: 2020-09-06 | Discharge: 2020-09-06 | Payer: BC Managed Care – PPO

## 2020-09-06 MED ADMIN — APIXABAN 5 MG PO TAB [315778]: 5 mg | ORAL | @ 02:00:00 | NDC 00003089431

## 2020-09-06 MED ADMIN — POTASSIUM CHLORIDE 20 MEQ PO TBTQ [35943]: 20 meq | ORAL | @ 11:00:00 | Stop: 2021-09-01 | NDC 00904708661

## 2020-09-06 MED ADMIN — SPIRONOLACTONE 25 MG PO TAB [7437]: 25 mg | ORAL | @ 14:00:00 | NDC 63739054410

## 2020-09-06 MED ADMIN — APIXABAN 5 MG PO TAB [315778]: 5 mg | ORAL | @ 14:00:00 | NDC 00003089431

## 2020-09-06 MED ADMIN — ASPIRIN 81 MG PO CHEW [680]: 81 mg | ORAL | @ 14:00:00 | NDC 66553000201

## 2020-09-06 MED ADMIN — SENNOSIDES-DOCUSATE SODIUM 8.6-50 MG PO TAB [40926]: 2 | ORAL | @ 14:00:00 | NDC 57896055510

## 2020-09-06 MED ADMIN — CARVEDILOL 12.5 MG PO TAB [77424]: 12.5 mg | ORAL | @ 02:00:00 | NDC 00904630261

## 2020-09-06 MED ADMIN — MAGNESIUM HYDROXIDE 2,400 MG/10 ML PO SUSP [136089]: 10 mL | ORAL | @ 22:00:00 | NDC 00904684072

## 2020-09-06 MED ADMIN — MAGNESIUM OXIDE 400 MG (241.3 MG MAGNESIUM) PO TAB [10491]: 800 mg | ORAL | @ 14:00:00 | NDC 10006070028

## 2020-09-06 MED ADMIN — GLIMEPIRIDE 2 MG PO TAB [16356]: 2 mg | ORAL | @ 14:00:00 | NDC 68084032611

## 2020-09-06 MED ADMIN — ONDANSETRON HCL (PF) 4 MG/2 ML IJ SOLN [136012]: 4 mg | INTRAVENOUS | @ 16:00:00 | NDC 36000001225

## 2020-09-06 MED ADMIN — SIMETHICONE 80 MG PO CHEW [7227]: 160 mg | ORAL | @ 14:00:00 | NDC 70000043401

## 2020-09-06 MED ADMIN — SIMETHICONE 80 MG PO CHEW [7227]: 160 mg | ORAL | @ 22:00:00 | NDC 70000043401

## 2020-09-06 MED ADMIN — POLYETHYLENE GLYCOL 3350 17 GRAM PO PWPK [25424]: 17 g | ORAL | @ 14:00:00 | NDC 00904693186

## 2020-09-06 MED ADMIN — METFORMIN 500 MG PO TAB [10544]: 500 mg | ORAL | @ 14:00:00 | Stop: 2020-09-06 | NDC 00904716261

## 2020-09-06 MED ADMIN — CARVEDILOL 12.5 MG PO TAB [77424]: 12.5 mg | ORAL | @ 14:00:00 | NDC 00904630261

## 2020-09-06 MED ADMIN — ACETAMINOPHEN 325 MG PO TAB [101]: 650 mg | ORAL | @ 14:00:00 | NDC 00904677361

## 2020-09-06 MED ADMIN — METFORMIN 500 MG PO TAB [10544]: 500 mg | ORAL | @ 22:00:00 | NDC 00904716261

## 2020-09-07 ENCOUNTER — Encounter: Admit: 2020-09-07 | Discharge: 2020-09-07 | Payer: BC Managed Care – PPO

## 2020-09-07 ENCOUNTER — Inpatient Hospital Stay: Admit: 2020-09-07 | Discharge: 2020-09-07 | Payer: BC Managed Care – PPO

## 2020-09-07 DIAGNOSIS — I4891 Unspecified atrial fibrillation: Secondary | ICD-10-CM

## 2020-09-07 MED ADMIN — LOSARTAN 50 MG PO TAB [76938]: 100 mg | ORAL | @ 02:00:00 | NDC 68084034711

## 2020-09-07 MED ADMIN — APIXABAN 5 MG PO TAB [315778]: 5 mg | ORAL | @ 14:00:00 | NDC 00003089431

## 2020-09-07 MED ADMIN — METFORMIN 500 MG PO TAB [10544]: 500 mg | ORAL | @ 14:00:00 | NDC 00904716261

## 2020-09-07 MED ADMIN — POLYETHYLENE GLYCOL 3350 17 GRAM PO PWPK [25424]: 17 g | ORAL | @ 15:00:00 | NDC 00904693186

## 2020-09-07 MED ADMIN — GLIMEPIRIDE 2 MG PO TAB [16356]: 2 mg | ORAL | @ 14:00:00 | NDC 68084032611

## 2020-09-07 MED ADMIN — SIMETHICONE 80 MG PO CHEW [7227]: 160 mg | ORAL | @ 02:00:00 | NDC 70000043401

## 2020-09-07 MED ADMIN — CARVEDILOL 12.5 MG PO TAB [77424]: 12.5 mg | ORAL | @ 14:00:00 | NDC 00904630261

## 2020-09-07 MED ADMIN — ASPIRIN 81 MG PO CHEW [680]: 81 mg | ORAL | @ 14:00:00 | NDC 66553000201

## 2020-09-07 MED ADMIN — APIXABAN 5 MG PO TAB [315778]: 5 mg | ORAL | @ 02:00:00 | NDC 00003089431

## 2020-09-07 MED ADMIN — SENNOSIDES-DOCUSATE SODIUM 8.6-50 MG PO TAB [40926]: 2 | ORAL | @ 02:00:00 | NDC 57896055510

## 2020-09-07 MED ADMIN — CARVEDILOL 12.5 MG PO TAB [77424]: 12.5 mg | ORAL | @ 02:00:00 | NDC 00904630261

## 2020-09-07 MED ADMIN — SENNOSIDES-DOCUSATE SODIUM 8.6-50 MG PO TAB [40926]: 2 | ORAL | @ 14:00:00 | NDC 57896055510

## 2020-09-07 MED ADMIN — SIMETHICONE 80 MG PO CHEW [7227]: 160 mg | ORAL | @ 14:00:00 | NDC 70000043401

## 2020-09-07 MED ADMIN — METFORMIN 500 MG PO TAB [10544]: 500 mg | ORAL | @ 23:00:00 | NDC 00904716261

## 2020-09-07 MED ADMIN — SPIRONOLACTONE 25 MG PO TAB [7437]: 25 mg | ORAL | @ 14:00:00 | NDC 63739054410

## 2020-09-08 ENCOUNTER — Inpatient Hospital Stay: Admit: 2020-09-08 | Discharge: 2020-09-08 | Payer: BC Managed Care – PPO

## 2020-09-08 MED ADMIN — APIXABAN 5 MG PO TAB [315778]: 5 mg | ORAL | @ 01:00:00 | NDC 00003089431

## 2020-09-08 MED ADMIN — SENNOSIDES-DOCUSATE SODIUM 8.6-50 MG PO TAB [40926]: 2 | ORAL | @ 01:00:00 | NDC 57896055510

## 2020-09-08 MED ADMIN — CARVEDILOL 12.5 MG PO TAB [77424]: 12.5 mg | ORAL | @ 01:00:00 | NDC 00904630261

## 2020-09-08 MED ADMIN — BUMETANIDE 1 MG PO TAB [9310]: 1 mg | ORAL | @ 14:00:00 | NDC 50268013111

## 2020-09-08 MED ADMIN — APIXABAN 5 MG PO TAB [315778]: 5 mg | ORAL | @ 14:00:00 | NDC 00003089431

## 2020-09-08 MED ADMIN — ASPIRIN 81 MG PO CHEW [680]: 81 mg | ORAL | @ 14:00:00 | NDC 66553000201

## 2020-09-08 MED ADMIN — LOSARTAN 50 MG PO TAB [76938]: 100 mg | ORAL | @ 01:00:00 | NDC 68084034711

## 2020-09-08 MED ADMIN — CARVEDILOL 12.5 MG PO TAB [77424]: 12.5 mg | ORAL | @ 14:00:00 | NDC 00904630261

## 2020-09-08 MED ADMIN — SIMETHICONE 80 MG PO CHEW [7227]: 160 mg | ORAL | @ 14:00:00 | NDC 70000043401

## 2020-09-08 MED ADMIN — SENNOSIDES-DOCUSATE SODIUM 8.6-50 MG PO TAB [40926]: 2 | ORAL | @ 14:00:00 | NDC 57896055510

## 2020-09-08 MED ADMIN — GLIMEPIRIDE 2 MG PO TAB [16356]: 2 mg | ORAL | @ 14:00:00 | NDC 68084032611

## 2020-09-08 MED ADMIN — SPIRONOLACTONE 25 MG PO TAB [7437]: 25 mg | ORAL | @ 14:00:00 | NDC 63739054410

## 2020-09-08 MED ADMIN — METFORMIN 500 MG PO TAB [10544]: 500 mg | ORAL | @ 14:00:00 | NDC 00904716261

## 2020-09-09 ENCOUNTER — Inpatient Hospital Stay: Admit: 2020-09-09 | Discharge: 2020-09-09 | Payer: BC Managed Care – PPO

## 2020-09-09 MED ADMIN — METFORMIN 500 MG PO TAB [10544]: 500 mg | ORAL | NDC 00904716261

## 2020-09-09 MED ADMIN — METFORMIN 500 MG PO TAB [10544]: 500 mg | ORAL | @ 14:00:00 | NDC 00904716261

## 2020-09-09 MED ADMIN — CARVEDILOL 12.5 MG PO TAB [77424]: 12.5 mg | ORAL | @ 14:00:00 | NDC 00904630261

## 2020-09-09 MED ADMIN — SENNOSIDES-DOCUSATE SODIUM 8.6-50 MG PO TAB [40926]: 2 | ORAL | @ 02:00:00 | NDC 57896055510

## 2020-09-09 MED ADMIN — APIXABAN 5 MG PO TAB [315778]: 5 mg | ORAL | @ 14:00:00 | NDC 00003089431

## 2020-09-09 MED ADMIN — SENNOSIDES-DOCUSATE SODIUM 8.6-50 MG PO TAB [40926]: 2 | ORAL | @ 14:00:00 | NDC 57896055510

## 2020-09-09 MED ADMIN — BUMETANIDE 1 MG PO TAB [9310]: 1 mg | ORAL | @ 14:00:00 | NDC 50268013111

## 2020-09-09 MED ADMIN — ASPIRIN 81 MG PO CHEW [680]: 81 mg | ORAL | @ 14:00:00 | NDC 66553000201

## 2020-09-09 MED ADMIN — APIXABAN 5 MG PO TAB [315778]: 5 mg | ORAL | @ 02:00:00 | NDC 00003089431

## 2020-09-09 MED ADMIN — GLIMEPIRIDE 2 MG PO TAB [16356]: 2 mg | ORAL | @ 14:00:00 | NDC 68084032611

## 2020-09-09 MED ADMIN — LOSARTAN 50 MG PO TAB [76938]: 100 mg | ORAL | @ 02:00:00 | NDC 68084034711

## 2020-09-09 MED ADMIN — SPIRONOLACTONE 25 MG PO TAB [7437]: 25 mg | ORAL | @ 14:00:00 | NDC 63739054410

## 2020-09-09 MED ADMIN — METFORMIN 500 MG PO TAB [10544]: 500 mg | ORAL | @ 23:00:00 | NDC 00904716261

## 2020-09-09 MED ADMIN — CARVEDILOL 12.5 MG PO TAB [77424]: 12.5 mg | ORAL | @ 02:00:00 | NDC 00904630261

## 2020-09-10 ENCOUNTER — Inpatient Hospital Stay: Admit: 2020-09-10 | Discharge: 2020-09-10 | Payer: BC Managed Care – PPO

## 2020-09-10 MED ADMIN — SPIRONOLACTONE 25 MG PO TAB [7437]: 25 mg | ORAL | @ 14:00:00 | NDC 63739054410

## 2020-09-10 MED ADMIN — BUMETANIDE 1 MG PO TAB [9310]: 1 mg | ORAL | @ 14:00:00 | NDC 50268013111

## 2020-09-10 MED ADMIN — APIXABAN 5 MG PO TAB [315778]: 5 mg | ORAL | @ 02:00:00 | NDC 00003089431

## 2020-09-10 MED ADMIN — POLYETHYLENE GLYCOL 3350 17 GRAM PO PWPK [25424]: 17 g | ORAL | @ 14:00:00 | Stop: 2020-09-10 | NDC 00904693186

## 2020-09-10 MED ADMIN — METFORMIN 500 MG PO TAB [10544]: 500 mg | ORAL | @ 14:00:00 | NDC 00904716261

## 2020-09-10 MED ADMIN — POTASSIUM CHLORIDE 20 MEQ PO TBTQ [35943]: 40 meq | ORAL | @ 12:00:00 | Stop: 2021-09-01 | NDC 00245531989

## 2020-09-10 MED ADMIN — CARVEDILOL 12.5 MG PO TAB [77424]: 12.5 mg | ORAL | @ 14:00:00 | NDC 00904630261

## 2020-09-10 MED ADMIN — ASPIRIN 81 MG PO CHEW [680]: 81 mg | ORAL | @ 14:00:00 | NDC 66553000201

## 2020-09-10 MED ADMIN — LOSARTAN 50 MG PO TAB [76938]: 100 mg | ORAL | @ 02:00:00 | NDC 68084034711

## 2020-09-10 MED ADMIN — GLIMEPIRIDE 2 MG PO TAB [16356]: 2 mg | ORAL | @ 14:00:00 | NDC 68084032611

## 2020-09-10 MED ADMIN — METFORMIN 500 MG PO TAB [10544]: 500 mg | ORAL | @ 23:00:00 | NDC 00904716261

## 2020-09-10 MED ADMIN — APIXABAN 5 MG PO TAB [315778]: 5 mg | ORAL | @ 14:00:00 | NDC 00003089431

## 2020-09-10 MED ADMIN — CARVEDILOL 12.5 MG PO TAB [77424]: 12.5 mg | ORAL | @ 02:00:00 | NDC 00904630261

## 2020-09-10 MED ADMIN — SIMETHICONE 80 MG PO CHEW [7227]: 160 mg | ORAL | @ 14:00:00 | NDC 70000043401

## 2020-09-10 MED ADMIN — SENNOSIDES-DOCUSATE SODIUM 8.6-50 MG PO TAB [40926]: 2 | ORAL | @ 14:00:00 | NDC 57896055510

## 2020-09-11 MED ADMIN — SPIRONOLACTONE 25 MG PO TAB [7437]: 25 mg | ORAL | @ 14:00:00 | NDC 63739054410

## 2020-09-11 MED ADMIN — MAGNESIUM OXIDE 400 MG (241.3 MG MAGNESIUM) PO TAB [10491]: 200 mg | ORAL | @ 14:00:00 | Stop: 2020-09-16 | NDC 10006070028

## 2020-09-11 MED ADMIN — LOSARTAN 50 MG PO TAB [76938]: 100 mg | ORAL | @ 01:00:00 | NDC 68084034711

## 2020-09-11 MED ADMIN — ASPIRIN 81 MG PO CHEW [680]: 81 mg | ORAL | @ 14:00:00 | NDC 66553000201

## 2020-09-11 MED ADMIN — BUMETANIDE 1 MG PO TAB [9310]: 1 mg | ORAL | @ 14:00:00 | NDC 50268013111

## 2020-09-11 MED ADMIN — SIMETHICONE 80 MG PO CHEW [7227]: 160 mg | ORAL | @ 14:00:00 | NDC 70000043401

## 2020-09-11 MED ADMIN — CARVEDILOL 12.5 MG PO TAB [77424]: 12.5 mg | ORAL | @ 14:00:00 | NDC 00904630261

## 2020-09-11 MED ADMIN — GLIMEPIRIDE 2 MG PO TAB [16356]: 2 mg | ORAL | @ 14:00:00 | NDC 68084032611

## 2020-09-11 MED ADMIN — APIXABAN 5 MG PO TAB [315778]: 5 mg | ORAL | @ 01:00:00 | NDC 00003089431

## 2020-09-11 MED ADMIN — ACETAMINOPHEN 325 MG PO TAB [101]: 650 mg | ORAL | @ 14:00:00 | NDC 00904677361

## 2020-09-11 MED ADMIN — METFORMIN 500 MG PO TAB [10544]: 500 mg | ORAL | @ 14:00:00 | NDC 00904716261

## 2020-09-11 MED ADMIN — POTASSIUM CHLORIDE 20 MEQ PO TBTQ [35943]: 20 meq | ORAL | @ 14:00:00 | Stop: 2021-09-01 | NDC 00245531989

## 2020-09-11 MED ADMIN — CARVEDILOL 12.5 MG PO TAB [77424]: 12.5 mg | ORAL | @ 01:00:00 | NDC 00904630261

## 2020-09-11 MED ADMIN — METFORMIN 500 MG PO TAB [10544]: 500 mg | ORAL | @ 23:00:00 | NDC 00904716261

## 2020-09-11 MED ADMIN — APIXABAN 5 MG PO TAB [315778]: 5 mg | ORAL | @ 14:00:00 | NDC 00003089431

## 2020-09-12 MED ADMIN — MAGNESIUM OXIDE 400 MG (241.3 MG MAGNESIUM) PO TAB [10491]: 200 mg | ORAL | @ 14:00:00 | Stop: 2020-09-16 | NDC 10006070028

## 2020-09-12 MED ADMIN — BUMETANIDE 1 MG PO TAB [9310]: 1 mg | ORAL | @ 14:00:00 | NDC 50268013111

## 2020-09-12 MED ADMIN — POTASSIUM CHLORIDE 20 MEQ PO TBTQ [35943]: 20 meq | ORAL | @ 14:00:00 | Stop: 2021-09-01 | NDC 00904708661

## 2020-09-12 MED ADMIN — GLIMEPIRIDE 2 MG PO TAB [16356]: 2 mg | ORAL | @ 14:00:00 | NDC 68084032611

## 2020-09-12 MED ADMIN — METFORMIN 500 MG PO TAB [10544]: 500 mg | ORAL | @ 22:00:00 | NDC 00904716261

## 2020-09-12 MED ADMIN — LOSARTAN 50 MG PO TAB [76938]: 100 mg | ORAL | @ 01:00:00 | NDC 68084034711

## 2020-09-12 MED ADMIN — CARVEDILOL 12.5 MG PO TAB [77424]: 12.5 mg | ORAL | @ 14:00:00 | NDC 00904630261

## 2020-09-12 MED ADMIN — ACETAMINOPHEN 325 MG PO TAB [101]: 650 mg | ORAL | @ 14:00:00 | NDC 00904677361

## 2020-09-12 MED ADMIN — APIXABAN 5 MG PO TAB [315778]: 5 mg | ORAL | @ 01:00:00 | NDC 00003089431

## 2020-09-12 MED ADMIN — CARVEDILOL 12.5 MG PO TAB [77424]: 12.5 mg | ORAL | @ 01:00:00 | NDC 00904630261

## 2020-09-12 MED ADMIN — SPIRONOLACTONE 25 MG PO TAB [7437]: 25 mg | ORAL | @ 14:00:00 | NDC 63739054410

## 2020-09-12 MED ADMIN — ASPIRIN 81 MG PO CHEW [680]: 81 mg | ORAL | @ 14:00:00 | NDC 66553000201

## 2020-09-12 MED ADMIN — APIXABAN 5 MG PO TAB [315778]: 5 mg | ORAL | @ 14:00:00 | NDC 00003089431

## 2020-09-13 ENCOUNTER — Encounter: Admit: 2020-09-13 | Discharge: 2020-09-13 | Payer: BC Managed Care – PPO

## 2020-09-13 MED ADMIN — GLIMEPIRIDE 2 MG PO TAB [16356]: 2 mg | ORAL | @ 14:00:00 | Stop: 2020-09-14 | NDC 68084032611

## 2020-09-13 MED ADMIN — ASPIRIN 81 MG PO CHEW [680]: 81 mg | ORAL | @ 14:00:00 | Stop: 2020-09-14 | NDC 66553000201

## 2020-09-13 MED ADMIN — LOSARTAN 50 MG PO TAB [76938]: 100 mg | ORAL | @ 02:00:00 | NDC 68084034711

## 2020-09-13 MED ADMIN — APIXABAN 5 MG PO TAB [315778]: 5 mg | ORAL | @ 14:00:00 | Stop: 2020-09-14 | NDC 00003089431

## 2020-09-13 MED ADMIN — BUMETANIDE 1 MG PO TAB [9310]: 1 mg | ORAL | @ 14:00:00 | Stop: 2020-09-14 | NDC 50268013111

## 2020-09-13 MED ADMIN — CARVEDILOL 12.5 MG PO TAB [77424]: 12.5 mg | ORAL | @ 02:00:00 | NDC 00904630261

## 2020-09-13 MED ADMIN — SIMETHICONE 80 MG PO CHEW [7227]: 160 mg | ORAL | @ 02:00:00 | NDC 70000043401

## 2020-09-13 MED ADMIN — APIXABAN 5 MG PO TAB [315778]: 5 mg | ORAL | @ 02:00:00 | NDC 00003089431

## 2020-09-13 MED ADMIN — CARVEDILOL 12.5 MG PO TAB [77424]: 12.5 mg | ORAL | @ 14:00:00 | Stop: 2020-09-14 | NDC 00904630261

## 2020-09-13 MED ADMIN — SPIRONOLACTONE 25 MG PO TAB [7437]: 25 mg | ORAL | @ 14:00:00 | Stop: 2020-09-14 | NDC 63739054410

## 2020-09-13 MED ADMIN — POTASSIUM CHLORIDE 20 MEQ PO TBTQ [35943]: 20 meq | ORAL | @ 11:00:00 | Stop: 2020-09-14 | NDC 00245531989

## 2020-09-25 ENCOUNTER — Encounter: Admit: 2020-09-25 | Discharge: 2020-09-25 | Payer: BC Managed Care – PPO

## 2020-09-25 ENCOUNTER — Ambulatory Visit: Admit: 2020-09-25 | Discharge: 2020-09-25 | Payer: BC Managed Care – PPO

## 2020-09-25 DIAGNOSIS — I361 Nonrheumatic tricuspid (valve) insufficiency: Secondary | ICD-10-CM

## 2020-09-25 DIAGNOSIS — I10 Essential (primary) hypertension: Secondary | ICD-10-CM

## 2020-09-25 DIAGNOSIS — R0600 Dyspnea, unspecified: Secondary | ICD-10-CM

## 2020-09-25 DIAGNOSIS — I272 Pulmonary hypertension, unspecified: Secondary | ICD-10-CM

## 2020-09-25 DIAGNOSIS — I340 Nonrheumatic mitral (valve) insufficiency: Secondary | ICD-10-CM

## 2020-09-25 DIAGNOSIS — C50919 Malignant neoplasm of unspecified site of unspecified female breast: Secondary | ICD-10-CM

## 2020-09-25 DIAGNOSIS — E119 Type 2 diabetes mellitus without complications: Secondary | ICD-10-CM

## 2020-09-25 DIAGNOSIS — Z9889 Other specified postprocedural states: Secondary | ICD-10-CM

## 2020-09-25 DIAGNOSIS — I428 Other cardiomyopathies: Secondary | ICD-10-CM

## 2020-09-25 DIAGNOSIS — B999 Unspecified infectious disease: Secondary | ICD-10-CM

## 2020-09-25 DIAGNOSIS — H547 Unspecified visual loss: Secondary | ICD-10-CM

## 2020-09-25 DIAGNOSIS — Z7901 Long term (current) use of anticoagulants: Secondary | ICD-10-CM

## 2020-09-25 DIAGNOSIS — E039 Hypothyroidism, unspecified: Secondary | ICD-10-CM

## 2020-09-25 DIAGNOSIS — M797 Fibromyalgia: Secondary | ICD-10-CM

## 2020-09-25 DIAGNOSIS — M810 Age-related osteoporosis without current pathological fracture: Secondary | ICD-10-CM

## 2020-09-25 DIAGNOSIS — E782 Mixed hyperlipidemia: Secondary | ICD-10-CM

## 2020-09-25 DIAGNOSIS — I253 Aneurysm of heart: Secondary | ICD-10-CM

## 2020-09-25 DIAGNOSIS — I00 Rheumatic fever without heart involvement: Secondary | ICD-10-CM

## 2020-09-25 DIAGNOSIS — I351 Nonrheumatic aortic (valve) insufficiency: Secondary | ICD-10-CM

## 2020-09-25 DIAGNOSIS — I5042 Chronic combined systolic (congestive) and diastolic (congestive) heart failure: Secondary | ICD-10-CM

## 2020-09-25 DIAGNOSIS — M549 Dorsalgia, unspecified: Secondary | ICD-10-CM

## 2020-09-25 DIAGNOSIS — G459 Transient cerebral ischemic attack, unspecified: Secondary | ICD-10-CM

## 2020-09-25 DIAGNOSIS — J431 Panlobular emphysema: Secondary | ICD-10-CM

## 2020-09-25 DIAGNOSIS — R51 Headache: Secondary | ICD-10-CM

## 2020-09-25 DIAGNOSIS — E785 Hyperlipidemia, unspecified: Secondary | ICD-10-CM

## 2020-09-25 DIAGNOSIS — I358 Other nonrheumatic aortic valve disorders: Secondary | ICD-10-CM

## 2020-09-25 DIAGNOSIS — I5022 Chronic systolic (congestive) heart failure: Secondary | ICD-10-CM

## 2020-09-25 DIAGNOSIS — K8020 Calculus of gallbladder without cholecystitis without obstruction: Secondary | ICD-10-CM

## 2020-09-25 DIAGNOSIS — M1990 Unspecified osteoarthritis, unspecified site: Secondary | ICD-10-CM

## 2020-09-25 DIAGNOSIS — C801 Malignant (primary) neoplasm, unspecified: Secondary | ICD-10-CM

## 2020-09-25 DIAGNOSIS — I2510 Atherosclerotic heart disease of native coronary artery without angina pectoris: Secondary | ICD-10-CM

## 2020-09-25 DIAGNOSIS — C539 Malignant neoplasm of cervix uteri, unspecified: Secondary | ICD-10-CM

## 2020-09-25 LAB — BASIC METABOLIC PANEL
ANION GAP: 8 (ref 3–12)
BLD UREA NITROGEN: 14 mg/dL (ref 7–25)
CALCIUM: 9.3 mg/dL (ref 8.5–10.6)
CHLORIDE: 105 MMOL/L (ref 98–110)
CO2: 29 MMOL/L (ref 21–30)
CREATININE: 0.8 mg/dL (ref 0.4–1.00)
EGFR: >60 mL/min (ref >60)
GLUCOSE,PANEL: 162 mg/dL — ABNORMAL HIGH (ref 70–100)
POTASSIUM: 4 MMOL/L (ref 3.5–5.1)
SODIUM: 142 MMOL/L (ref 137–147)

## 2020-09-25 MED ORDER — LOSARTAN 100 MG PO TAB
150 mg | ORAL_TABLET | Freq: Every day | ORAL | 0 refills | Status: CN
Start: 2020-09-25 — End: ?

## 2020-09-25 NOTE — Progress Notes
Date of Service: 09/25/2020    Kimberly Mitchell is a 80 y.o. female.       HPI     CHERLIN THONG past medical history includes HFrEF, aortic valve sclerosis, arthritis, atrial septal aneurysm, breast cancer, cancer of cervix, chronic combined systolic and diastolic heart failure, diabetes, hypertension. She follows with Dr. Pierre Bali in [place].    Kimberly Mitchell was seen today in HF clinic for post-hospitalization follow up. She was recently hospitalized from 08/30/20 to 5/422 for severe mitral regurgitation and fluid overload.   Per review of Dr. Pierre Bali last office visit. Last echo prior to hospitalization 05/08/2020, EF decreased to 25 to 30%, LV was 5 cm in diameter with a moderate reduction in RV function and normal RV size. She does have a interatrial septal aneurysm and severe mitral valve regurgitation with moderate MAC. 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  TEE 07/10/2020 showed MR moderate to severe, EF 25% with reduced RV function as well.  TR was considered severe and central with hepatic vein flow reversal.  She has been previously seen by Dr. Mackey Birchwood for her valvular disease.  They had previously recommended optimization of medical therapy and consideration of MitraClip.  Her PYP scan in April 2021 was negative not suggestive of ATTR amyloidosis.  She is on Eliquis for DVT/PE.  She was continued on Bumex 2 mg in the morning and 1 mg in the afternoon as well as metolazone every other day.  She was started on Farxiga 10 mg daily at her last office visit.  Her Bumex was increased to 2 mg twice daily.      she  was last seen in clinic on 08/30/20 by Dorena Cookey, APRN. During that office visit she reported increased shortness of breath.  Moderately decompensated.; was instructed to be admitted to the hospital.  Valve team will follow during her hospitalization.      Today she reports since she  was last seen in clinic they have been doing ok. Denies having shortness of breath, no chest pain/pressures, no palpitations. Denies lower extremity edema, no abdominal distention. Denies dizziness/lightheadedness. Currently sleeping with 1-2 pillows, denies PND. Home systolic blood pressures averaging not checking, HR averaging not checking. Home weights have been no checking lbs.  she  has been trying to adhere to low sodium diet, drinking under 64 ounces of fluid per day           Vitals:    09/25/20 1120   BP: (!) 152/84   BP Source: Arm, Right Upper   Pulse: 86   SpO2: 96%   O2 Device: None (Room air)   PainSc: Ten   Weight: 62.7 kg (138 lb 3.2 oz)   Height: 160 cm (5' 3)     Body mass index is 24.48 kg/m?Marland Kitchen     Review of Systems   Constitutional: Negative for weight gain and weight loss.   Cardiovascular: Negative for chest pain, dyspnea on exertion, leg swelling, near-syncope, orthopnea, palpitations, paroxysmal nocturnal dyspnea and syncope.   Respiratory: Negative for cough and shortness of breath.    Hematologic/Lymphatic: Negative for bleeding problem. Does not bruise/bleed easily.   Musculoskeletal: Negative for muscle weakness.   Gastrointestinal: Negative for bloating and excessive appetite.   Genitourinary: Positive for frequency.   Neurological: Negative for weakness.   Psychiatric/Behavioral: Negative for altered mental status.       Physical Exam  General Appearance: well nourished female   Neck Veins: JVP 8, no HJR  Respiratory Effort: breathing comfortably, no respiratory distress   Auscultation: non-labored respiratory pattern, lungs clear to auscultation, no rales, rhonchi or wheezing   Cardiac Rhythm: regular rhythm and normal rate   Cardiac Auscultation: S1, S2 normal, no definite S3/S4   Murmurs: + murmur   Peripheral Circulation: normal peripheral circulation   Lower Extremity Edema: no lower extremity edema  Abdominal Exam: soft, non-tender, non-distended, bowel sounds present  Gait & Station: walks without assistance   Orientation: oriented to person, place and time   Affect & Mood: appropriate and sustained affect   Language and Memory: patient responsive and seems to comprehend information   Neurologic Exam: neurological assessment grossly intact   Vital signs reviewed    Cardiovascular Studies  For 722  FINAL IMPRESSION:    1. Elevated right and left-sided filling pressures.  2. Elevated mean pulmonary artery pressure.  3. Reduced cardiac indices by Fick and thermodilution method.  4. Elevated systemic vascular resistance.  5. Elevated pulmonary vascular resistance.  6. Moderate nonobstructive coronary artery disease involving the left anterior descending artery.  7. Elevated left ventricular end-diastolic pressure.  8. No significant gradient across the aortic valve on pullback.  9. The mean gradient across the mitral valve was 3.6 mmHg, with a valve area of 1.16 sq cm.  ?  05/08/2020 echo  RECOMMENDATION:  The patient will follow up with Advanced Heart Failure physician Dr. Erskine Squibb [place] for further management.  10. The left ventricle is dilated and spherical in shape.  Moderate concentric hypertrophy.  Severely reduced systolic function. Estimated EF 25-30%.   11. Normal right ventricular size.  Mildly reduced systolic function.  12. Biatrial dilatation.  13. The aortic valve is heavily calcified.  At least mild stenosis.  Likely low-flow low gradient (SVI 25 mL/m?).  Moderate regurgitation.  14. Moderate calcification of the mitral valve annulus.  No stenosis.  Severe regurgitation.  15. 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  16. Trivial pericardial effusion.  17. Estimated pulmonary artery systolic pressure 72 mmHg.   18. 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  ?  Comparison is made with a prior transthoracic echocardiogram performed 07/20/2019.  There has been interval reduction in left ventricular systolic function.  Mitral and tricuspid valve regurgitation also appears to have worsened.  Pulmonary artery systolic pressure was previously estimated at 64 mmHg.  ?  08/26/2019 PYP amyloidosis  SUMMARY/OPINION:   This study is negative and not suggestive of ATTR cardiac amyloidosis.  The heart contralateral lung metric is low equivocal but visual semiquantitative grading is not definitive for myocardial uptake.  An exam that is negative does not exclude AL cardiac amyloidosis.  If the clinical suspicion of amyloidosis is intermediate to high consider cardiac MRI with native T1 and T2 mapping and measurements as well as gadolinium and delayed hyperenhancement imaging.      Cardiovascular Health Factors  Vitals BP Readings from Last 3 Encounters:   09/25/20 (!) 152/84   09/13/20 124/63   08/30/20 (!) 160/110     Wt Readings from Last 3 Encounters:   09/25/20 62.7 kg (138 lb 3.2 oz)   09/13/20 59.8 kg (131 lb 12.8 oz)   09/01/20 65.8 kg (145 lb)     BMI Readings from Last 3 Encounters:   09/25/20 24.48 kg/m?   09/13/20 21.93 kg/m?   09/01/20 24.13 kg/m?      Smoking Social History     Tobacco Use   Smoking Status Never Smoker  Smokeless Tobacco Never Used      Lipid Profile Cholesterol   Date Value Ref Range Status   08/17/2020 180 <200 MG/DL Final     HDL   Date Value Ref Range Status   08/17/2020 66 >40 MG/DL Final     LDL   Date Value Ref Range Status   08/17/2020 85 <100 mg/dL Final     Triglycerides   Date Value Ref Range Status   08/17/2020 86 <150 MG/DL Final      Blood Sugar Hemoglobin A1C   Date Value Ref Range Status   08/30/2020 8.8 (H) 4.0 - 6.0 % Final     Comment:     The ADA recommends that most patients with type 1 and type 2 diabetes maintain   an A1c level <7%.       Glucose   Date Value Ref Range Status   09/13/2020 105 (H) 70 - 100 MG/DL Final   03/47/4259 563 (H) 70 - 100 MG/DL Final   87/56/4332 951 (H) 70 - 100 MG/DL Final     Glucose, POC   Date Value Ref Range Status   09/13/2020 150 (H) 70 - 100 MG/DL Final   88/41/6606 301 (H) 70 - 100 MG/DL Final   60/02/9322 557 (H) 70 - 100 MG/DL Final          Problems Addressed Today  Encounter Diagnoses   Name Primary?   ? Chronic combined systolic (congestive) and diastolic (congestive) heart failure (HCC) Yes   ? Essential hypertension    ? Mixed hyperlipidemia    ? Nonrheumatic tricuspid valve regurgitation    ? Moderate mitral regurgitation    ? Mild CAD    ? Valvular cardiomyopathy (HCC)    ? S/P mitral valve clip implantation    ? Nonrheumatic aortic valve insufficiency    ? Type 2 diabetes mellitus without complication, without long-term current use of insulin (HCC)    ? Chronic anticoagulation    ? Acquired hypothyroidism    ? 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  Likely increased left atrial pressure.  EF 25% right ventricle is mildly enlarged with reduced systolic function, mild to moderate aortic regurgitation. Ms Shadrick today is describing NYHA Functional Class III and AHA stage C symptoms.   Currently taking Bumex 1 mg daily for diuretic.   Today on exam she is euvolemic  Instructed to continue current dose  Estimated dry weight is 135 lbs       GDMT PTA Changes   BB Yes (Carvedilol) 5 mg twice daily      ACEI/ARB/ARNI  Yes  Losartan 100 mg  09/25/2020: Increase losartan 150 mg daily   SGLT-2 Inhibitor Farxiga 10 mg daily      Aldosterone Antagonist  Yes  Spironolactone 25 mg daily    Diuretic  Bumex 1 mg daily      Hydralazine/ Nitrate  No (Not receiving therapeutic doses of ACE-i/ARB and BB)      Ivabradine No; Not treated with maximally tolerated dose beta blockers or beta blockers contraindicated  NA    HRMT  No (Not receiving optimal medical therapy for at least 3 months)       Anticoagulation for Afib/flutter  Yes  Eliquis 5 mg twice daily    ?  Cardiac Rehab Evaluation? for LVEF <40%  NA    7-day post hospital follow up scheduled within 48hrs of discharge  NA Repeat basic panel in 1 week.   CAD  02/04/2011 last ischemic evaluation shows low risk study for significant coronary ischemia  Denies having coronary symptoms today. Continue on asa and statin.    HTN  Blood pressure today in clinic is 152/84. Continue on  per table above  >losartan increased 150 mg daily    DVT/PE  Currently  anti-coagulated with  Eliquis 5 mg twice daily denies having missed doses, no s/s of bleeding reported. CHADs2VASc score today is 6      CKD  Labs today show creatinine of pending. Baseline creatinine has ranged 1.041.24. Will monitor closely.    DM2  Currently taking glimepiride 2 mg daily, Farxiga 10 mg daily, managed by PCP   Lab Results   Component Value Date/Time    HGBA1C 8.8 (H) 08/30/2020 10:41 AM    HGBA1C 7.8 (H) 07/27/2006 12:35 AM        Dyslipidemia  Currently receiving Lipitor 20 mg daily.  Denies myalgias. Lipid profile as noted below.   Lab Results   Component Value Date    CHOL 180 08/17/2020    TRIG 86 08/17/2020    HDL 66 08/17/2020    LDL 85 08/17/2020    VLDL 17 08/17/2020    NONHDLCHOL 114 08/17/2020    CHOLHDLC 3 09/15/2018   .       Follow up  Appointment scheduled to return to clinic in 2 weeks with HF provider in [place]. Will plan to check labs basic panel in 1weeks. she   and daughter are both in agreement and state understanding of this plan. Please see AVS for full patient education.     Education/care coordination and counseling time spent: 20 minutes of 30 minute visit.  Topics included HF disease process, medication instructions, daily weight monitoring, sodium and fluid restriction, understanding of HF symptoms, awareness of when and whom call, and when to schedule next office visit.     Thank you for the opportunity to participate in this pleasant patient's care. Please do not hesitate to contact me with any questions/concerns.    Laurette Schimke APRN-NP / CVM Advanced Practice Provider  Advanced Heart Failure APP  [facility]  Phone [phone]  Fax [phone]  [email or web address]  [address], [address]  [place], [place] [address]    Collaborating physician Renita Papa, MD           Current Medications (including today's revisions)  ? acetaminophen (TYLENOL) 325 mg tablet Take two tablets by mouth every 6 hours as needed.   ? albuterol sulfate (PROAIR HFA) 90 mcg/actuation HFA aerosol inhaler Inhale two puffs by mouth into the lungs every 6 hours as needed for Wheezing.   ? aspirin 81 mg chewable tablet Chew one tablet by mouth daily. Take with food.  Indications: stroke prevention   ? atorvastatin (LIPITOR) 20 mg tablet Take one tablet by mouth daily.   ? bumetanide (BUMEX) 2 mg tablet Take one-half tablet by mouth daily.   ? carvediloL (COREG) 25 mg tablet Take one-half tablet by mouth twice daily.   ? dapagliflozin (FARXIGA) 10 mg tablet Take one tablet by mouth daily.   ? ELIQUIS 5 mg tablet Take 1 tablet by mouth twice daily.   ? glimepiride (AMARYL) 2 mg tablet Take 1 tablet by mouth daily.   ? ibuprofen (  ADVIL) 200 mg tablet Take 200 mg by mouth every 6 hours as needed.   ? levothyroxine (SYNTHROID) 200 mcg tablet Take 1 tablet by mouth daily.   ? losartan (COZAAR) 100 mg tablet Take 1.5 tablets by mouth daily.   ? metFORMIN-XR (GLUCOPHAGE XR) 500 mg extended release tablet Take 500 mg by mouth daily.   ? spironolactone (ALDACTONE) 25 mg tablet Take one tablet by mouth daily. Take with food.   ? traMADoL (ULTRAM) 50 mg tablet Take 1 tablet by mouth as Needed.            Past Medical History  Patient Active Problem List    Diagnosis Date Noted   ? Postoperative anemia due to acute blood loss 09/03/2020   ? AKI (acute kidney injury) (HCC) 09/03/2020   ? Nonrheumatic mitral valve stenosis 09/03/2020   ? Chronic anticoagulation 09/03/2020     Eliquis     ? Fall at home 09/03/2020     R hip pain d/t fall preop     ? S/P mitral valve clip implantation 09/01/2020     09/01/20 - [facility]     ? Hypokalemia 09/01/2020 ? Acute on chronic combined systolic (congestive) and diastolic (congestive) heart failure (HCC) 08/30/2020   ? Moderate mitral regurgitation 08/30/2020   ? Mild CAD 08/30/2020     08/18/20: Cath by Dr. Mackey Birchwood - Mitraclip work-up  1. Left main coronary artery:  The left main coronary artery arises normally from the left coronary sinus.  The left main coronary artery has a very short course and it immediately bifurcates into left anterior descending artery and left circumflex artery.  The left anterior descending artery is free of angiographically significant disease.  2. Left anterior descending artery:  The left anterior descending artery is a large caliber vessel, which gives rise to 2 diagonal vessels.  The mid left anterior descending artery has a focal 40% stenosis.  This is a type 1 LAD.  3. Left circumflex artery:  The left circumflex artery is a large caliber dominant vessel, which gives rise to a medium caliber obtuse marginal branch.  Then, the left circumflex artery distally gives rise to left posterolateral branches and left posterior descending artery.  The left circumflex artery as well as its branches are free of angiographically significant disease.  4. Right coronary artery:  The right coronary artery is a small caliber and nondominant vessel, which arises from the right coronary cusp.  The right coronary artery is free of angiographically significant disease.     ? 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 Chronic combined systolic (congestive) and diastolic (congestive) heart failure (HCC) 07/10/2020   ? Tricuspid regurgitation 07/10/2020   ? Dyspnea      a. 07/2019 hospitalization for worsening  SOA @ [facility], [place]      ? Pulmonary hypertension (HCC)      a. 07/20/2019 - Echo - EF 55% / PAP 64 mmHg / moderate aortic stenosis, moderate aortic insufficiency/ mitral annular calcificaiton and moderate mitral regurgitation @ [facility]     ? 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 Use of letrozole (Femara) 07/14/2017   ? 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 History of breast cancer 02/10/2014   ? Carotid bruit 11/26/2012   ? 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 Aortic insufficiency 09/25/2008     Moderate     ?  Fibromyalgia 09/25/2008     Follows with a rheumatologist in [place] (based out of [facility])     ? Atrial septal aneurysm 09/25/2008   ? Hypothyroidism 09/25/2008   ? Essential hypertension 07/27/2006     02/06 renal ultrasound: Normal segmental arterial wave forms in both kidneys. No signs of renal  artery stenosis.     ? Type 2 diabetes mellitus (HCC) 07/27/2006   ? Hyperlipidemia 07/27/2006

## 2020-09-27 ENCOUNTER — Encounter: Admit: 2020-09-27 | Discharge: 2020-09-27 | Payer: BC Managed Care – PPO

## 2020-09-27 DIAGNOSIS — I5042 Chronic combined systolic (congestive) and diastolic (congestive) heart failure: Secondary | ICD-10-CM

## 2020-09-27 DIAGNOSIS — I428 Other cardiomyopathies: Secondary | ICD-10-CM

## 2020-09-27 DIAGNOSIS — I5043 Acute on chronic combined systolic (congestive) and diastolic (congestive) heart failure: Secondary | ICD-10-CM

## 2020-10-04 ENCOUNTER — Encounter: Admit: 2020-10-04 | Discharge: 2020-10-04 | Payer: BC Managed Care – PPO

## 2020-10-04 DIAGNOSIS — Z9889 Other specified postprocedural states: Secondary | ICD-10-CM

## 2020-10-04 DIAGNOSIS — I10 Essential (primary) hypertension: Secondary | ICD-10-CM

## 2020-10-04 LAB — CBC
HEMATOCRIT: 37 K/UL (ref 0–0.20)
HEMOGLOBIN: 11 mL/min — ABNORMAL LOW (ref >60)
MCH: 27
MCHC: 29 — ABNORMAL LOW
MCV: 91
PLATELET COUNT: 199
RBC COUNT: 4.1 K/UL — ABNORMAL LOW (ref 3–12)
RDW: 15 — ABNORMAL HIGH
WBC COUNT: 4.8 U/L — ABNORMAL LOW (ref 7–56)

## 2020-10-06 ENCOUNTER — Encounter: Admit: 2020-10-06 | Discharge: 2020-10-06 | Payer: BC Managed Care – PPO

## 2020-10-06 MED FILL — LOSARTAN 100 MG PO TAB: 100 mg | ORAL | 30 days supply | Qty: 45 | Fill #1 | Status: AC

## 2020-10-13 ENCOUNTER — Encounter: Admit: 2020-10-13 | Discharge: 2020-10-13 | Payer: BC Managed Care – PPO

## 2020-10-13 ENCOUNTER — Ambulatory Visit: Admit: 2020-10-13 | Discharge: 2020-10-13 | Payer: BC Managed Care – PPO

## 2020-10-13 DIAGNOSIS — I358 Other nonrheumatic aortic valve disorders: Secondary | ICD-10-CM

## 2020-10-13 DIAGNOSIS — I361 Nonrheumatic tricuspid (valve) insufficiency: Secondary | ICD-10-CM

## 2020-10-13 DIAGNOSIS — M797 Fibromyalgia: Secondary | ICD-10-CM

## 2020-10-13 DIAGNOSIS — Z9889 Other specified postprocedural states: Secondary | ICD-10-CM

## 2020-10-13 DIAGNOSIS — C539 Malignant neoplasm of cervix uteri, unspecified: Secondary | ICD-10-CM

## 2020-10-13 DIAGNOSIS — I340 Nonrheumatic mitral (valve) insufficiency: Secondary | ICD-10-CM

## 2020-10-13 DIAGNOSIS — I00 Rheumatic fever without heart involvement: Secondary | ICD-10-CM

## 2020-10-13 DIAGNOSIS — B999 Unspecified infectious disease: Secondary | ICD-10-CM

## 2020-10-13 DIAGNOSIS — M1990 Unspecified osteoarthritis, unspecified site: Secondary | ICD-10-CM

## 2020-10-13 DIAGNOSIS — K8020 Calculus of gallbladder without cholecystitis without obstruction: Secondary | ICD-10-CM

## 2020-10-13 DIAGNOSIS — I428 Other cardiomyopathies: Secondary | ICD-10-CM

## 2020-10-13 DIAGNOSIS — E782 Mixed hyperlipidemia: Secondary | ICD-10-CM

## 2020-10-13 DIAGNOSIS — I5043 Acute on chronic combined systolic (congestive) and diastolic (congestive) heart failure: Secondary | ICD-10-CM

## 2020-10-13 DIAGNOSIS — E039 Hypothyroidism, unspecified: Secondary | ICD-10-CM

## 2020-10-13 DIAGNOSIS — R0600 Dyspnea, unspecified: Secondary | ICD-10-CM

## 2020-10-13 DIAGNOSIS — I253 Aneurysm of heart: Secondary | ICD-10-CM

## 2020-10-13 DIAGNOSIS — C801 Malignant (primary) neoplasm, unspecified: Secondary | ICD-10-CM

## 2020-10-13 DIAGNOSIS — I480 Paroxysmal atrial fibrillation: Secondary | ICD-10-CM

## 2020-10-13 DIAGNOSIS — I5042 Chronic combined systolic (congestive) and diastolic (congestive) heart failure: Secondary | ICD-10-CM

## 2020-10-13 DIAGNOSIS — E119 Type 2 diabetes mellitus without complications: Secondary | ICD-10-CM

## 2020-10-13 DIAGNOSIS — I10 Essential (primary) hypertension: Secondary | ICD-10-CM

## 2020-10-13 DIAGNOSIS — M549 Dorsalgia, unspecified: Secondary | ICD-10-CM

## 2020-10-13 DIAGNOSIS — I4891 Unspecified atrial fibrillation: Secondary | ICD-10-CM

## 2020-10-13 DIAGNOSIS — I351 Nonrheumatic aortic (valve) insufficiency: Secondary | ICD-10-CM

## 2020-10-13 DIAGNOSIS — J431 Panlobular emphysema: Secondary | ICD-10-CM

## 2020-10-13 DIAGNOSIS — H547 Unspecified visual loss: Secondary | ICD-10-CM

## 2020-10-13 DIAGNOSIS — M810 Age-related osteoporosis without current pathological fracture: Secondary | ICD-10-CM

## 2020-10-13 DIAGNOSIS — I5022 Chronic systolic (congestive) heart failure: Secondary | ICD-10-CM

## 2020-10-13 DIAGNOSIS — C50919 Malignant neoplasm of unspecified site of unspecified female breast: Secondary | ICD-10-CM

## 2020-10-13 DIAGNOSIS — R51 Headache: Secondary | ICD-10-CM

## 2020-10-13 DIAGNOSIS — I2510 Atherosclerotic heart disease of native coronary artery without angina pectoris: Secondary | ICD-10-CM

## 2020-10-13 DIAGNOSIS — G459 Transient cerebral ischemic attack, unspecified: Secondary | ICD-10-CM

## 2020-10-13 DIAGNOSIS — E785 Hyperlipidemia, unspecified: Secondary | ICD-10-CM

## 2020-10-13 NOTE — Progress Notes
Date of Service: 10/13/2020    Kimberly Mitchell is a 80 y.o. female.       HPI     I had the pleasure of seeing Kimberly Mitchell for 1 month follow up after Mitraclip.  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 ?Other known medical problems includes history of breast cancer, diabetes mellitus, hypothyroidism, peripheral neuropathy, atrial fibrillation, and pulmonary emboli. ?She underwent recent echo Doppler in December 2021 which revealed a worsening of her ejection fraction down to 25 to 30%, moderate concentric LVH. ?She had a heavily calcified aortic valve with low gradient and moderate regurgitation. ?She was also noted to have severe mitral regurgitation with mitral annular calcification and severe tricuspid insufficiency with a pulmonary artery systolic pressure of 72 mmHg. ?Right and left heart catheterization on 08/17/2020 revealed elevated right and left heart filling pressures, elevated mean pulmonary artery pressure and reduced cardiac indices by Fick and thermodilution.  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  The mean gradient across the mitral valve was 3.6 mmHg with a valve area of 1.16 cm?.    ?She was referred to the valve clinic for further management of her valvular disease.  I last saw her on 08/30/20 for H&P prior to Mitraclip and she was admitted to the hospital for acute decompensated heart failure.  During her hospitalization she underwent MitraClip implantation on 09/01/2020 with Dr. Elias Else and Dr. Mackey Birchwood.  She tolerated the procedure well and her postoperative echocardiogram demonstrated mild to moderate mitral insufficiency with a single MitraClip in place with moderate mitral stenosis and a mean gradient of 8 mmHg.  She was seen in follow-up by the heart failure team on 09/25/2020 and was felt to be euvolemic.    Today she reports significant symptomatic improvement since MitraClip.  She does still note some mild shortness of breath with exertion as well as feels she is quite debilitated.  She denies orthopnea, PND, or lower extremity edema.  Her weight has been stable at approximately 133 pounds.  She denies chest pain, palpitations, dizziness, lightheadedness, or near syncope.  Her blood pressure is significantly elevated in the clinic today and she reports she has not had any of her antihypertensives.    Echo Doppler today is pending.       Vitals:    10/13/20 1306   BP: (!) 193/110   BP Source: Arm, Left Upper   Pulse: 97   PainSc: Zero   Weight: 61.7 kg (136 lb)   Height: 160 cm (5' 3)     Body mass index is 24.09 kg/m?Marland Kitchen     Past Medical History  Patient Active Problem List    Diagnosis Date Noted   ? Atrial fibrillation (HCC) 10/13/2020   ? Postoperative anemia due to acute blood loss 09/03/2020   ? AKI (acute kidney injury) (HCC) 09/03/2020   ? Nonrheumatic mitral valve stenosis 09/03/2020   ? Chronic anticoagulation 09/03/2020     Eliquis     ? Fall at home 09/03/2020     R hip pain d/t fall preop     ? S/P mitral valve clip implantation 09/01/2020     09/01/20 - [facility]     ? Hypokalemia 09/01/2020   ? Acute on chronic combined systolic (congestive) and diastolic (congestive) heart failure (HCC)  08/30/2020   ? Moderate mitral regurgitation 08/30/2020   ? Mild CAD 08/30/2020     08/18/20: Cath by Dr. Mackey Birchwood - Mitraclip work-up  1. Left main coronary artery:  The left main coronary artery arises normally from the left coronary sinus.  The left main coronary artery has a very short course and it immediately bifurcates into left anterior descending artery and left circumflex artery.  The left anterior descending artery is free of angiographically significant disease.  2. Left anterior descending artery:  The left anterior descending artery is a large caliber vessel, which gives rise to 2 diagonal vessels.  The mid left anterior descending artery has a focal 40% stenosis.  This is a type 1 LAD.  3. Left circumflex artery:  The left circumflex artery is a large caliber dominant vessel, which gives rise to a medium caliber obtuse marginal branch.  Then, the left circumflex artery distally gives rise to left posterolateral branches and left posterior descending artery.  The left circumflex artery as well as its branches are free of angiographically significant disease.  4. Right coronary artery:  The right coronary artery is a small caliber and nondominant vessel, which arises from the right coronary cusp.  The right coronary artery is free of angiographically significant disease.     ? 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 Chronic combined systolic (congestive) and diastolic (congestive) heart failure (HCC) 07/10/2020   ? Tricuspid regurgitation 07/10/2020   ? Dyspnea      a. 07/2019 hospitalization for worsening  SOA @ [facility] [place]      ? Pulmonary hypertension (HCC)      a. 07/20/2019 - Echo - EF 55% / PAP 64 mmHg / moderate aortic stenosis, moderate aortic insufficiency/ mitral annular calcificaiton and moderate mitral regurgitation @ [facility]     ? 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 Use of letrozole (Femara) 07/14/2017   ? 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 History of breast cancer 02/10/2014   ? Carotid bruit 11/26/2012   ? 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 Aortic insufficiency 09/25/2008     Moderate     ? Fibromyalgia 09/25/2008     Follows with a rheumatologist in [place] (based out of [facility])     ? Atrial septal aneurysm 09/25/2008   ? Hypothyroidism 09/25/2008   ? Essential hypertension 07/27/2006     02/06 renal ultrasound: Normal segmental arterial wave forms in both kidneys. No signs of renal  artery stenosis.     ? Type 2 diabetes mellitus (HCC) 07/27/2006   ? Hyperlipidemia 07/27/2006         Review of Systems   Constitutional: Positive for malaise/fatigue.   HENT: Negative.    Eyes: Negative.    Cardiovascular: Negative.    Respiratory: Negative.    Endocrine: Negative.    Hematologic/Lymphatic: Negative.    Skin: Negative.    Musculoskeletal: Negative.    Gastrointestinal: Negative.    Genitourinary: Negative.    Neurological: Negative.    Psychiatric/Behavioral: Negative.    All other systems reviewed and are negative.      Physical Exam  General Appearance: no acute distress  Skin: warm & intact  HEENT: unremarkable  Neck Veins: neck veins are flat & not distended  Chest Inspection: chest is normal in appearance  Auscultation/Percussion: lungs clear to auscultation, no rales, rhonchi,  or wheezing  Cardiac Rhythm: regular rhythm & normal rate  Cardiac Auscultation: Normal S1 & S2, no S3 or S4, no rub  Murmurs: 2/6 systolic murmur  Extremities: no lower extremity edema  Abdominal Exam: soft, non-tender, no masses, bowel sounds normal  Liver & Spleen: no organomegaly  Neurologic Exam: oriented to time, place and person; no focal neurologic deficits  Psychiatric: Normal mood and affect.  Behavior is normal. Judgment and thought content normal.         Cardiovascular Studies  Preliminary EKG: Sinus rhythm, heart rate 97, reviewed with Dr. Doristine Counter in clinic    Cardiovascular Health Factors  Vitals BP Readings from Last 3 Encounters:   10/13/20 (!) 193/110   10/13/20 (!) 193/110   09/25/20 (!) 152/84     Wt Readings from Last 3 Encounters:   10/13/20 61.7 kg (136 lb)   10/13/20 61.7 kg (136 lb)   09/25/20 62.7 kg (138 lb 3.2 oz)     BMI Readings from Last 3 Encounters:   10/13/20 24.09 kg/m?   10/13/20 24.09 kg/m?   09/25/20 24.48 kg/m?      Smoking Social History     Tobacco Use   Smoking Status Never Smoker   Smokeless Tobacco Never Used      Lipid Profile Cholesterol   Date Value Ref Range Status   08/17/2020 180 <200 MG/DL Final     HDL   Date Value Ref Range Status   08/17/2020 66 >40 MG/DL Final     LDL   Date Value Ref Range Status   08/17/2020 85 <100 mg/dL Final     Triglycerides   Date Value Ref Range Status   08/17/2020 86 <150 MG/DL Final      Blood Sugar Hemoglobin A1C   Date Value Ref Range Status   08/30/2020 8.8 (H) 4.0 - 6.0 % Final     Comment:     The ADA recommends that most patients with type 1 and type 2 diabetes maintain   an A1c level <7%.       Glucose   Date Value Ref Range Status   10/04/2020 187 (H) 70 - 105 Final   09/25/2020 162 (H) 70 - 100 MG/DL Final   09/81/1914 782 (H) 70 - 100 MG/DL Final     Glucose, POC   Date Value Ref Range Status   09/13/2020 150 (H) 70 - 100 MG/DL Final   95/62/1308 657 (H) 70 - 100 MG/DL Final   84/69/6295 284 (H) 70 - 100 MG/DL Final          Problems Addressed Today  Encounter Diagnoses   Name Primary?   ? Moderate mitral regurgitation Yes   ? S/P mitral valve clip implantation    ? Type 2 diabetes mellitus without complication, without long-term current use of insulin (HCC)    ? Essential hypertension    ? Mixed hyperlipidemia    ? Chronic combined systolic (congestive) and diastolic (congestive) heart failure (HCC)    ? Nonrheumatic tricuspid valve regurgitation    ? Mild CAD    ? Valvular cardiomyopathy (HCC)    ? Nonrheumatic aortic valve insufficiency    ? Paroxysmal atrial fibrillation (HCC)        Assessment and Plan     Valvular disease  Severe mitral regurgitation/moderate stenosis   aortic regurgitation  Tricuspid regurgitation  She underwent successful MitraClip x1.  Echo Doppler from today is pending.  She does have moderate mitral stenosis as well as  moderate tricuspid regurgitation which we will plan to follow closely.  She has completed home health nursing and has been cleared to start cardiac rehab which she will plan to do at [facility].  I have asked her to gradually increase her activity as tolerated.    Chronic combined systolic and diastolic heart failure  Valvular cardiomyopathy  She follows closely with heart failure team.  She appears well compensated today on exam.  She describes NYHA class 2 heart failure symptoms.  She states these have significantly improved since MitraClip.    Atrial Fibrillation  She is on chronic anticoagulation with Eliquis.  She has a scheduled appointment next week with Dr. Betti Cruz for consideration of watchman left atrial appendage closure device.  There is a reported history of frequent falls.    Mixed hyperlipidemia    Primary hypertension  Her blood pressure is significantly elevated today in clinic however she reports she has not had any of her antihypertensives today.  She reports blood pressures at home have been averaging 130-140 systolic.    Pulmonary hypertension    Diabetes mellitus      I appreciate the opportunity to participate in her care.    Dorena Cookey, APRN  Pager [phone]           Current Medications (including today's revisions)  ? acetaminophen (TYLENOL) 325 mg tablet Take two tablets by mouth every 6 hours as needed.   ? albuterol sulfate (PROAIR HFA) 90 mcg/actuation HFA aerosol inhaler Inhale two puffs by mouth into the lungs every 6 hours as needed for Wheezing.   ? aspirin 81 mg chewable tablet Chew one tablet by mouth daily. Take with food.  Indications: stroke prevention   ? atorvastatin (LIPITOR) 20 mg tablet Take one tablet by mouth daily.   ? bumetanide (BUMEX) 2 mg tablet Take one-half tablet by mouth daily.   ? carvediloL (COREG) 25 mg tablet Take one-half tablet by mouth twice daily.   ? dapagliflozin (FARXIGA) 10 mg tablet Take one tablet by mouth daily.   ? ELIQUIS 5 mg tablet Take 1 tablet by mouth twice daily.   ? glimepiride (AMARYL) 2 mg tablet Take 1 tablet by mouth daily.   ? ibuprofen (ADVIL) 200 mg tablet Take 200 mg by mouth every 6 hours as needed.   ? levothyroxine (SYNTHROID) 200 mcg tablet Take 1 tablet by mouth daily.   ? losartan (COZAAR) 100 mg tablet Take 1.5 tablets by mouth daily.   ? metFORMIN-XR (GLUCOPHAGE XR) 500 mg extended release tablet Take 500 mg by mouth daily.   ? spironolactone (ALDACTONE) 25 mg tablet Take one tablet by mouth daily. Take with food.   ? traMADoL (ULTRAM) 50 mg tablet Take 1 tablet by mouth as Needed.

## 2020-10-13 NOTE — Patient Instructions
No changes to your medications today.      I will send a referral for cardiac rehab at the [facility] are cleared to start cardiac rehab.     We will review your ultrasound from today and will continue to watch your other valves closely.

## 2020-10-26 ENCOUNTER — Ambulatory Visit: Admit: 2020-10-26 | Discharge: 2020-10-26 | Payer: BC Managed Care – PPO

## 2020-10-26 ENCOUNTER — Encounter: Admit: 2020-10-26 | Discharge: 2020-10-26 | Payer: BC Managed Care – PPO

## 2020-10-26 DIAGNOSIS — C50919 Malignant neoplasm of unspecified site of unspecified female breast: Secondary | ICD-10-CM

## 2020-10-26 DIAGNOSIS — I358 Other nonrheumatic aortic valve disorders: Secondary | ICD-10-CM

## 2020-10-26 DIAGNOSIS — I5022 Chronic systolic (congestive) heart failure: Secondary | ICD-10-CM

## 2020-10-26 DIAGNOSIS — C801 Malignant (primary) neoplasm, unspecified: Secondary | ICD-10-CM

## 2020-10-26 DIAGNOSIS — M797 Fibromyalgia: Secondary | ICD-10-CM

## 2020-10-26 DIAGNOSIS — I4891 Unspecified atrial fibrillation: Secondary | ICD-10-CM

## 2020-10-26 DIAGNOSIS — R51 Headache: Secondary | ICD-10-CM

## 2020-10-26 DIAGNOSIS — E119 Type 2 diabetes mellitus without complications: Secondary | ICD-10-CM

## 2020-10-26 DIAGNOSIS — R0600 Dyspnea, unspecified: Secondary | ICD-10-CM

## 2020-10-26 DIAGNOSIS — M1990 Unspecified osteoarthritis, unspecified site: Secondary | ICD-10-CM

## 2020-10-26 DIAGNOSIS — C539 Malignant neoplasm of cervix uteri, unspecified: Secondary | ICD-10-CM

## 2020-10-26 DIAGNOSIS — I10 Essential (primary) hypertension: Secondary | ICD-10-CM

## 2020-10-26 DIAGNOSIS — I253 Aneurysm of heart: Secondary | ICD-10-CM

## 2020-10-26 DIAGNOSIS — I00 Rheumatic fever without heart involvement: Secondary | ICD-10-CM

## 2020-10-26 DIAGNOSIS — I5042 Chronic combined systolic (congestive) and diastolic (congestive) heart failure: Secondary | ICD-10-CM

## 2020-10-26 DIAGNOSIS — K8020 Calculus of gallbladder without cholecystitis without obstruction: Secondary | ICD-10-CM

## 2020-10-26 DIAGNOSIS — I428 Other cardiomyopathies: Secondary | ICD-10-CM

## 2020-10-26 DIAGNOSIS — B999 Unspecified infectious disease: Secondary | ICD-10-CM

## 2020-10-26 DIAGNOSIS — E039 Hypothyroidism, unspecified: Secondary | ICD-10-CM

## 2020-10-26 DIAGNOSIS — J431 Panlobular emphysema: Secondary | ICD-10-CM

## 2020-10-26 DIAGNOSIS — I340 Nonrheumatic mitral (valve) insufficiency: Secondary | ICD-10-CM

## 2020-10-26 DIAGNOSIS — H547 Unspecified visual loss: Secondary | ICD-10-CM

## 2020-10-26 DIAGNOSIS — M549 Dorsalgia, unspecified: Secondary | ICD-10-CM

## 2020-10-26 DIAGNOSIS — E785 Hyperlipidemia, unspecified: Secondary | ICD-10-CM

## 2020-10-26 DIAGNOSIS — G459 Transient cerebral ischemic attack, unspecified: Secondary | ICD-10-CM

## 2020-10-26 DIAGNOSIS — M810 Age-related osteoporosis without current pathological fracture: Secondary | ICD-10-CM

## 2020-10-26 NOTE — Patient Instructions
Stephanie Stokka, RN is the Navigator for the Watchman procedure. She will be in touch with you to introduce herself and schedule the remainder of your appointments as well as review your Watchman procedure plan. Feel free to send an email to initiate contact.     Her contact information is as follows:    Stephanie Stokka, RN   Cardiac Navigator  [phone] office  [email or web address]    Feel free to reach out should you have questions.

## 2020-11-02 ENCOUNTER — Encounter: Admit: 2020-11-02 | Discharge: 2020-11-02 | Payer: BC Managed Care – PPO

## 2020-11-02 DIAGNOSIS — I480 Paroxysmal atrial fibrillation: Secondary | ICD-10-CM

## 2020-11-02 DIAGNOSIS — I4891 Unspecified atrial fibrillation: Secondary | ICD-10-CM

## 2020-11-02 MED ORDER — SODIUM CHLORIDE 0.9 % IV SOLP
INTRAVENOUS | 0 refills
Start: 2020-11-02 — End: ?

## 2020-11-02 MED ORDER — LIDOCAINE (PF) 10 MG/ML (1 %) IJ SOLN
.2 mL | INTRAMUSCULAR | 0 refills | PRN
Start: 2020-11-02 — End: ?

## 2020-11-02 MED ORDER — ASPIRIN 325 MG PO TAB
325 mg | Freq: Once | ORAL | 0 refills | Status: AC
Start: 2020-11-02 — End: ?

## 2020-11-02 MED ORDER — CEFAZOLIN INJ 1GM IVP
2 g | Freq: Once | INTRAVENOUS | 0 refills
Start: 2020-11-02 — End: ?

## 2020-11-02 MED ORDER — LIDOCAINE HCL 2 % MM JELP
Freq: Once | TOPICAL | 0 refills
Start: 2020-11-02 — End: ?

## 2020-11-02 NOTE — Telephone Encounter
Called patient to schedule Watchman implant with Dr. Reece Levy  Patient agreed to date of service, 03/14/2021  Pre procedure appointments discussed.  Patient verbalized understanding of dates/times/locations of all appointments.  Pre procedure instructions sent via mail per request.

## 2020-11-02 NOTE — Patient Instructions
ELECTROPHYSIOLOGY PRE-ADMISSION INSTRUCTIONS    Patient Name: Kimberly Mitchell  MRN#: [id number]  Date of Birth: [birth year] (80 y.o.)  Today's Date: 11/02/2020    PROCEDURE:  You are scheduled for Left Atrial Appendage Closure Device Placement (Watchman) with Dr. Jerrye Bushy. Reddy.      ARRIVAL TIME:  [facility] the [facility] admitting office on the ground floor of the [facility] on: 03/14/2021  The EP Lab will call to notify you of your arrival time.  They will call on the business day prior to your procedure.  (If you have any questions regarding your arrival time for the Electrophysiology Lab, please call the EP Lab at [phone].)    PRE-PROCEDURE APPOINTMENTS:  03/02/2021 at 1:30 pm   Office visit to update history and physical (requirement within 30 days of procedure) with Jene Every, APRN at [facility]   03/02/2021 at 2:30 pm Pre-Operative Assessment Clinic visit with Anesthesia Department.  Go to the main hospital entrance of the [facility]. The anesthesia clinic is just inside the entrance on the left side of lobby -  across from the Information Desk.     03/02/2021 Pre-Admission lab work: BMP, CBC and Magnesium at your Pre-Operative Assessment Visit.         SPECIAL MEDICATION INSTRUCTIONS  Nothing to eat or drink after midnight before your procedure.  Take your prescription medications with a sip of water as instructed.  No caffeine for 24 hours prior to your procedure.     Any new prescriptions will be sent to your pharmacy listed on file with [place].   HOLD ALL over the counter vitamins or supplements on the morning of your procedure.  Diuretics: Aldactone (spironolactone) -- hold the morning of your procedure.  and Bumex (bumetanide) -- hold the morning of your procedure.   Hypoglycemics: metformin (Glucophage) -- hold the morning of your procedure. , glimepiride (Amaryl) -- hold the morning of your procedure.  and dapagliflozin Marcelline Deist) -- hold the morning of your procedure  ACE/ARBs: Cozaar (losartan) -- hold the morning of your procedure.   Other: Aspirin -- hold the morning of your procedure  Anticoagulants: Do not take apixaban (Eliquis) on the morning of your procedure. --- DO NOT MISS ANY OTHER DOSES ---         Additional Instructions  If you wear CPAP, please bring your mask and machine with you to the hospital.    Take a bath or shower with anti-bacterial soap the evening before, or the morning of the procedure. We will give this to you at your office visit.     Bring photo ID and your health insurance card(s).    Arrange for a driver to take you home from the hospital.    Bring an accurate list of your current medications with you to the hospital (all meds and supplements taken daily).    Wear comfortable clothes and don't bring valuables, other than photo identification card, with you to the hospital.    Please pack a bag for an overnight stay.     Please review your pre-procedure instructions and call the office at [phone] with any questions. You may ask to speak with any of Dr. Jerrye Bushy. Reddy's nurses. There are several of [place] in the office that can assist you. For questions regarding post procedure care or restrictions please refer to the Your Care Instructions included in the  Left Atrial Appendage Closure Device Placement (Watchman)  Packet.     ALLERGIES  Allergies   Allergen Reactions   ? Diltiazem EDEMA   ? Metronidazole ITCHING and SEE COMMENTS     Low heart rate   ? Methylprednisone UNKNOWN       IMPORTANT INFORMATION:    VISITORS:  Per the current visitor policy for the Cardiovascular Labs, patients are allowed 1 visitor only in the pre/post rooms and no visitors are allowed to stay overnight with the patient.  There are several hotels near the hospital if you need to arrange for accommodations for your family or friend providing support and transportation home after your procedure.     FOLLOW UP APPTS: There are several follow up appointments throughout the first year after implantation of this device.  I will contact you to schedule throughout the year.    45 day post procedure Office Visit and TEE (transesophageal echocardiogram)  We want to ensure there are no clots or leakage around the implant before anticoagulation is discontinued and Plavix is started.   6 months post procedure Office Visit  Most patients are able to stop the Plavix at this point and be on aspirin only for life.   1 year post procedure Office Visit and TEE (transesophageal echocardiogram)  This is our first chance to see what the implant looks like with no anticoagulation and no plavix.  We want to ensure there are no clots forming at the implant site.     Also, please remember:    You MUST take antibiotics prior to any dental procedures (including cleaning), invasive respiratory tract procedures and invasive skin procedures for 6 months after your device placement.  This will help to prevent infection to the device.  Contact your primary care provider or cardiologist for an antibiotic prescription to be taken prior to any of the above listed procedures.  _________________________________________  Form completed by: Tempie Hoist, BSN  Date completed: 11/02/20  Method: Via telephone and mailed to the patient.     Please let me know if you have any questions or concerns.    Thanks so much,  Tempie Hoist, BSN, RN, [facility]- Nurse Navigator   [facility] [place] [facility]  [facility]  Phone: [phone]- [email or web address]  [address] [place], [place], [place] [address]

## 2020-11-03 ENCOUNTER — Inpatient Hospital Stay: Admit: 2020-11-03 | Discharge: 2020-11-03 | Payer: BC Managed Care – PPO

## 2020-11-03 DIAGNOSIS — I4891 Unspecified atrial fibrillation: Secondary | ICD-10-CM

## 2020-11-12 ENCOUNTER — Encounter: Admit: 2020-11-12 | Discharge: 2020-11-12 | Payer: BC Managed Care – PPO

## 2020-11-12 DIAGNOSIS — E119 Type 2 diabetes mellitus without complications: Secondary | ICD-10-CM

## 2020-11-12 DIAGNOSIS — R51 Headache: Secondary | ICD-10-CM

## 2020-11-12 DIAGNOSIS — I00 Rheumatic fever without heart involvement: Secondary | ICD-10-CM

## 2020-11-12 DIAGNOSIS — K8020 Calculus of gallbladder without cholecystitis without obstruction: Secondary | ICD-10-CM

## 2020-11-12 DIAGNOSIS — R0600 Dyspnea, unspecified: Secondary | ICD-10-CM

## 2020-11-12 DIAGNOSIS — H547 Unspecified visual loss: Secondary | ICD-10-CM

## 2020-11-12 DIAGNOSIS — M1990 Unspecified osteoarthritis, unspecified site: Secondary | ICD-10-CM

## 2020-11-12 DIAGNOSIS — M549 Dorsalgia, unspecified: Secondary | ICD-10-CM

## 2020-11-12 DIAGNOSIS — M810 Age-related osteoporosis without current pathological fracture: Secondary | ICD-10-CM

## 2020-11-12 DIAGNOSIS — I340 Nonrheumatic mitral (valve) insufficiency: Secondary | ICD-10-CM

## 2020-11-12 DIAGNOSIS — E039 Hypothyroidism, unspecified: Secondary | ICD-10-CM

## 2020-11-12 DIAGNOSIS — I5042 Chronic combined systolic (congestive) and diastolic (congestive) heart failure: Secondary | ICD-10-CM

## 2020-11-12 DIAGNOSIS — G459 Transient cerebral ischemic attack, unspecified: Secondary | ICD-10-CM

## 2020-11-12 DIAGNOSIS — I10 Essential (primary) hypertension: Secondary | ICD-10-CM

## 2020-11-12 DIAGNOSIS — B999 Unspecified infectious disease: Secondary | ICD-10-CM

## 2020-11-12 DIAGNOSIS — C50919 Malignant neoplasm of unspecified site of unspecified female breast: Secondary | ICD-10-CM

## 2020-11-12 DIAGNOSIS — I4891 Unspecified atrial fibrillation: Secondary | ICD-10-CM

## 2020-11-12 DIAGNOSIS — I253 Aneurysm of heart: Secondary | ICD-10-CM

## 2020-11-12 DIAGNOSIS — I358 Other nonrheumatic aortic valve disorders: Secondary | ICD-10-CM

## 2020-11-12 DIAGNOSIS — J431 Panlobular emphysema: Secondary | ICD-10-CM

## 2020-11-12 DIAGNOSIS — I5022 Chronic systolic (congestive) heart failure: Secondary | ICD-10-CM

## 2020-11-12 DIAGNOSIS — I428 Other cardiomyopathies: Secondary | ICD-10-CM

## 2020-11-12 DIAGNOSIS — C539 Malignant neoplasm of cervix uteri, unspecified: Secondary | ICD-10-CM

## 2020-11-12 DIAGNOSIS — C801 Malignant (primary) neoplasm, unspecified: Secondary | ICD-10-CM

## 2020-11-12 DIAGNOSIS — E785 Hyperlipidemia, unspecified: Secondary | ICD-10-CM

## 2020-11-12 DIAGNOSIS — M797 Fibromyalgia: Secondary | ICD-10-CM

## 2020-11-22 ENCOUNTER — Encounter: Admit: 2020-11-22 | Discharge: 2020-11-22 | Payer: BC Managed Care – PPO

## 2020-11-22 ENCOUNTER — Ambulatory Visit: Admit: 2020-11-22 | Discharge: 2020-11-23 | Payer: BC Managed Care – PPO

## 2020-11-22 ENCOUNTER — Ambulatory Visit: Admit: 2020-11-22 | Discharge: 2020-11-22 | Payer: BC Managed Care – PPO

## 2020-11-22 DIAGNOSIS — M810 Age-related osteoporosis without current pathological fracture: Secondary | ICD-10-CM

## 2020-11-22 DIAGNOSIS — H547 Unspecified visual loss: Secondary | ICD-10-CM

## 2020-11-22 DIAGNOSIS — E785 Hyperlipidemia, unspecified: Secondary | ICD-10-CM

## 2020-11-22 DIAGNOSIS — M1990 Unspecified osteoarthritis, unspecified site: Secondary | ICD-10-CM

## 2020-11-22 DIAGNOSIS — C539 Malignant neoplasm of cervix uteri, unspecified: Secondary | ICD-10-CM

## 2020-11-22 DIAGNOSIS — R51 Headache: Secondary | ICD-10-CM

## 2020-11-22 DIAGNOSIS — M797 Fibromyalgia: Secondary | ICD-10-CM

## 2020-11-22 DIAGNOSIS — I4891 Unspecified atrial fibrillation: Secondary | ICD-10-CM

## 2020-11-22 DIAGNOSIS — G459 Transient cerebral ischemic attack, unspecified: Secondary | ICD-10-CM

## 2020-11-22 DIAGNOSIS — I253 Aneurysm of heart: Secondary | ICD-10-CM

## 2020-11-22 DIAGNOSIS — R0600 Dyspnea, unspecified: Secondary | ICD-10-CM

## 2020-11-22 DIAGNOSIS — E039 Hypothyroidism, unspecified: Secondary | ICD-10-CM

## 2020-11-22 DIAGNOSIS — I5042 Chronic combined systolic (congestive) and diastolic (congestive) heart failure: Secondary | ICD-10-CM

## 2020-11-22 DIAGNOSIS — I428 Other cardiomyopathies: Secondary | ICD-10-CM

## 2020-11-22 DIAGNOSIS — I358 Other nonrheumatic aortic valve disorders: Secondary | ICD-10-CM

## 2020-11-22 DIAGNOSIS — I5022 Chronic systolic (congestive) heart failure: Secondary | ICD-10-CM

## 2020-11-22 DIAGNOSIS — E119 Type 2 diabetes mellitus without complications: Secondary | ICD-10-CM

## 2020-11-22 DIAGNOSIS — K8020 Calculus of gallbladder without cholecystitis without obstruction: Secondary | ICD-10-CM

## 2020-11-22 DIAGNOSIS — C801 Malignant (primary) neoplasm, unspecified: Secondary | ICD-10-CM

## 2020-11-22 DIAGNOSIS — C50919 Malignant neoplasm of unspecified site of unspecified female breast: Secondary | ICD-10-CM

## 2020-11-22 DIAGNOSIS — I10 Essential (primary) hypertension: Secondary | ICD-10-CM

## 2020-11-22 DIAGNOSIS — J431 Panlobular emphysema: Secondary | ICD-10-CM

## 2020-11-22 DIAGNOSIS — I00 Rheumatic fever without heart involvement: Secondary | ICD-10-CM

## 2020-11-22 DIAGNOSIS — M549 Dorsalgia, unspecified: Secondary | ICD-10-CM

## 2020-11-22 DIAGNOSIS — B999 Unspecified infectious disease: Secondary | ICD-10-CM

## 2020-11-22 DIAGNOSIS — I340 Nonrheumatic mitral (valve) insufficiency: Secondary | ICD-10-CM

## 2020-11-22 DIAGNOSIS — E1165 Type 2 diabetes mellitus with hyperglycemia: Secondary | ICD-10-CM

## 2020-11-22 LAB — COMPREHENSIVE METABOLIC PANEL
ALBUMIN: 3.7 g/dL (ref 3.5–5.0)
ALK PHOSPHATASE: 83 U/L (ref 25–110)
ALT: 11 U/L (ref 7–56)
ANION GAP: 9 (ref 3–12)
AST: 14 U/L (ref 7–40)
BLD UREA NITROGEN: 17 mg/dL (ref 7–25)
CALCIUM: 9.4 mg/dL (ref 8.5–10.6)
CHLORIDE: 102 MMOL/L (ref 98–110)
CO2: 31 MMOL/L — ABNORMAL HIGH (ref 21–30)
CREATININE: 0.8 mg/dL (ref 0.4–1.00)
EGFR: >60 mL/min (ref >60)
GLUCOSE,PANEL: 198 mg/dL — ABNORMAL HIGH (ref 70–100)
POTASSIUM: 3.8 MMOL/L (ref 3.5–5.1)
SODIUM: 142 MMOL/L (ref 137–147)
TOTAL BILIRUBIN: 0.5 mg/dL (ref 0.3–1.2)
TOTAL PROTEIN: 7.1 g/dL (ref 6.0–8.0)

## 2020-11-22 LAB — THYROID STIMULATING HORMONE-TSH: TSH: 0.1 uU/mL — ABNORMAL LOW (ref 0.35–5.00)

## 2020-11-22 LAB — FREE T4 (FREE THYROXINE) ONLY: FREE T4: 1.2 ng/dL (ref 0.6–1.6)

## 2020-11-22 MED ORDER — BLOOD GLUCOSE TEST MISC STRP
1 | Freq: Every day | 3 refills | 50.00000 days | Status: AC
Start: 2020-11-22 — End: ?

## 2020-11-22 MED ORDER — LANCETS MISC MISC
1 | Freq: Every day | 3 refills | Status: AC | PRN
Start: 2020-11-22 — End: ?

## 2020-11-22 MED ORDER — ONETOUCH VERIO METER MISC MISC
1 | Freq: Before meals | 0 refills | Status: AC
Start: 2020-11-22 — End: ?

## 2020-11-22 NOTE — Progress Notes
Date of Service: 11/22/2020    Subjective:             Kimberly Mitchell is a 80 y.o. female who presents to the endocrine clinic as a hospital follow up for hypothyroidism. She also has history of type 2 diabetes managed by her PCP. She is accompanied by her husband today.     History of Present Illness  Today, Kimberly Mitchell reports that she has ongoing fatigue and dry skin. She is currently taking levothyroxine 175 mcg daily. She does try to take this on an empty stomach and wait > 30 minutes before eating, but does sometimes forget to do this. She has missed a couple of doses. She gets her medications in pill packs from [facility].     She noted polyuria a few weeks ago-- this has since improved. She did not check her blood sugar at that time. She plans to set a diabetes follow up with her PCP.        Review of Systems  Positive for dry skin, cold intolerance, fatigue.     Objective:         ? acetaminophen (TYLENOL) 325 mg tablet Take two tablets by mouth every 6 hours as needed.   ? albuterol sulfate (PROAIR HFA) 90 mcg/actuation HFA aerosol inhaler Inhale two puffs by mouth into the lungs every 6 hours as needed for Wheezing.   ? aspirin 81 mg chewable tablet Chew one tablet by mouth daily. Take with food.  Indications: stroke prevention   ? atorvastatin (LIPITOR) 20 mg tablet Take one tablet by mouth daily.   ? bumetanide (BUMEX) 2 mg tablet Take one-half tablet by mouth daily.   ? carvediloL (COREG) 25 mg tablet Take one-half tablet by mouth twice daily.   ? dapagliflozin (FARXIGA) 10 mg tablet Take one tablet by mouth daily.   ? ELIQUIS 5 mg tablet Take 1 tablet by mouth twice daily.   ? glimepiride (AMARYL) 2 mg tablet Take 1 tablet by mouth daily.   ? ibuprofen (ADVIL) 200 mg tablet Take 200 mg by mouth every 6 hours as needed.   ? levothyroxine (SYNTHROID) 200 mcg tablet Take 1 tablet by mouth daily.   ? losartan (COZAAR) 100 mg tablet Take 1.5 tablets by mouth daily.   ? metFORMIN-XR (GLUCOPHAGE XR) 500 mg extended release tablet Take 500 mg by mouth daily.   ? spironolactone (ALDACTONE) 25 mg tablet Take one tablet by mouth daily. Take with food.   ? traMADoL (ULTRAM) 50 mg tablet Take 1 tablet by mouth as Needed.     Vitals:    11/22/20 1120   BP: (!) 171/85   Pulse: 78   Temp: 36.9 ?C (98.5 ?F)   PainSc: Zero   Weight: 62.3 kg (137 lb 6.4 oz)   Height: 162.6 cm (5' 4)     Body mass index is 23.58 kg/m?Marland Kitchen     Physical Exam  Vitals and nursing note reviewed.   Constitutional:       General: She is not in acute distress.     Appearance: Normal appearance. She is not ill-appearing.      Comments: In wheelchair   HENT:      Head: Normocephalic and atraumatic.      Nose: Nose normal.   Eyes:      Conjunctiva/sclera: Conjunctivae normal.   Skin:     Comments: Dry skin on lower legs   Neurological:      Mental Status: She is alert and  oriented to person, place, and time.   Psychiatric:         Mood and Affect: Mood normal.         Behavior: Behavior normal.         Thought Content: Thought content normal.         Judgment: Judgment normal.       Lab Results   Component Value Date    TSH 21.73 (H) 08/30/2020    TSH 22.81 (H) 11/19/2019    TSH 9.49 (H) 07/20/2019    TSH 4.71 (H) 05/16/2010          Assessment and Plan:  Hypothyroidism   - Elevated TSH during admission in 08/2020   - Repeat TSH today  - Goal TSH in the normal range   - Was planned to be discharged on 200 mcg daily, but currently taking 175 mcg daily. [facility] confirmed this with LPN today.    Type 2 Diabetes   - On glimepiride, metformin XR, dapagliflozin  - Needs new testing supplies-- sent for her today  - On ARB   - Managed by PCP. Encouraged to set a follow up visit.   - Referred to DM education today     Return to clinic in 6 months for hypothyroidism follow up visit, sooner for DM education    Suhas Estis Antonieta Loveless, PA-C                          Total Time Today was 30 minutes in the following activities: Preparing to see the patient, Obtaining and/or reviewing separately obtained history, Performing a medically appropriate examination and/or evaluation, Counseling and educating the patient/family/caregiver, Ordering medications, tests, or procedures, Documenting clinical information in the electronic or other health record, Independently interpreting results (not separately reported) and communicating results to the patient/family/caregiver and Care coordination (not separately reported)

## 2020-11-22 NOTE — Patient Instructions
My nurse can be reached directly at [phone].     Please let us know if you have any questions.     Great to see you today!    - Demeisha Geraghty Kingsley Ty Oshima, PA-C

## 2020-11-23 ENCOUNTER — Encounter: Admit: 2020-11-23 | Discharge: 2020-11-23 | Payer: BC Managed Care – PPO

## 2020-11-23 DIAGNOSIS — E1165 Type 2 diabetes mellitus with hyperglycemia: Secondary | ICD-10-CM

## 2020-11-23 DIAGNOSIS — E039 Hypothyroidism, unspecified: Secondary | ICD-10-CM

## 2020-11-23 MED ORDER — LEVOTHYROXINE 150 MCG PO TAB
150 ug | ORAL_TABLET | Freq: Every day | ORAL | 3 refills | 30.00000 days | Status: AC
Start: 2020-11-23 — End: ?

## 2020-12-27 ENCOUNTER — Ambulatory Visit: Admit: 2020-12-27 | Discharge: 2020-12-27 | Payer: BC Managed Care – PPO

## 2020-12-27 ENCOUNTER — Encounter: Admit: 2020-12-27 | Discharge: 2020-12-27 | Payer: BC Managed Care – PPO

## 2020-12-27 DIAGNOSIS — E1165 Type 2 diabetes mellitus with hyperglycemia: Secondary | ICD-10-CM

## 2020-12-27 NOTE — Patient Instructions
Hi Kimberly Mitchell,    It was great meeting with you today. I enjoyed our conversation.     These were the goals we talked about today:  - We reviewed some basics of blood sugar control. All foods can be broken down into 3 categories: protein, fat or carbohydrates. We reviewed food sources of eat nutrient. We discussed how carbohydrate containing foods are foods that influence blood sugar the most. It is important to get enough protein and fat at meals to keep you full and to keep your blood sugars stable. You don't want eliminate carbohydrates completely, but choose a fiber rich (less processed source) which is broken down slowly/slows digestion.     - We reviewed blood sugar targets:   Before meals/fasting: 80-130 mg/dl  2 hours after eating: less than 180 mg/dl  Low blood sugar: less than 70 mg/dl    *If blood sugar is less than 70 mg/dl, treat with 15 grams of quick-acting sugar (1/2 cup juice, 3 glucose tabs, 15 skittles, 1 package of fruit snacks, box of mini raisins, etc)    Wait 15 minutes, recheck your blood glucose level. If your blood glucose is still low, consume another 15 grams of carbohydrate and recheck 15 minutes later.     Since blood glucose levels may begin to drop again about 40-60 minutes after treatment, it is a good idea to recheck your blood glucose ~ 1 hour after treating a low. Once your blood glucose is in a normal range, eat a combination snack of carbohydrate and protein (apple and peanut butter or cheese and crackers).     - check your blood sugar daily   - follow up with primary care about your pain in hands and feet    Please feel free to reach out if you have any questions.     Kendrick Fries, RD, LD  Registered Dietitian and Certified Diabetes Educator  [place]

## 2021-02-07 ENCOUNTER — Encounter: Admit: 2021-02-07 | Discharge: 2021-02-07 | Payer: BC Managed Care – PPO

## 2021-02-07 NOTE — Progress Notes
Website with [employer], confirmed benefits and eligibility:  Current and active since 05/13/20, $3000 deductible with required co-insurance of 20% to max OOP $4000, then plan will pay 100% of allowable charges.      Prior auth initiated with Joy with [employer] for [place]. Case is PENDING at this time. Clinicals have been faxed to [phone] for review.  CASE # [id number]

## 2021-03-02 ENCOUNTER — Encounter: Admit: 2021-03-02 | Discharge: 2021-03-02 | Payer: BC Managed Care – PPO

## 2021-03-02 ENCOUNTER — Ambulatory Visit: Admit: 2021-03-02 | Discharge: 2021-03-02 | Payer: BC Managed Care – PPO

## 2021-03-02 DIAGNOSIS — I480 Paroxysmal atrial fibrillation: Secondary | ICD-10-CM

## 2021-03-02 DIAGNOSIS — Z01810 Encounter for preprocedural cardiovascular examination: Secondary | ICD-10-CM

## 2021-03-02 DIAGNOSIS — R51 Headache: Secondary | ICD-10-CM

## 2021-03-02 DIAGNOSIS — I351 Nonrheumatic aortic (valve) insufficiency: Secondary | ICD-10-CM

## 2021-03-02 DIAGNOSIS — I10 Essential (primary) hypertension: Secondary | ICD-10-CM

## 2021-03-02 DIAGNOSIS — E785 Hyperlipidemia, unspecified: Secondary | ICD-10-CM

## 2021-03-02 DIAGNOSIS — M797 Fibromyalgia: Secondary | ICD-10-CM

## 2021-03-02 DIAGNOSIS — I361 Nonrheumatic tricuspid (valve) insufficiency: Secondary | ICD-10-CM

## 2021-03-02 DIAGNOSIS — I2510 Atherosclerotic heart disease of native coronary artery without angina pectoris: Secondary | ICD-10-CM

## 2021-03-02 DIAGNOSIS — G459 Transient cerebral ischemic attack, unspecified: Secondary | ICD-10-CM

## 2021-03-02 DIAGNOSIS — I5042 Chronic combined systolic (congestive) and diastolic (congestive) heart failure: Secondary | ICD-10-CM

## 2021-03-02 DIAGNOSIS — C50919 Malignant neoplasm of unspecified site of unspecified female breast: Secondary | ICD-10-CM

## 2021-03-02 DIAGNOSIS — C801 Malignant (primary) neoplasm, unspecified: Secondary | ICD-10-CM

## 2021-03-02 DIAGNOSIS — E782 Mixed hyperlipidemia: Secondary | ICD-10-CM

## 2021-03-02 DIAGNOSIS — I358 Other nonrheumatic aortic valve disorders: Secondary | ICD-10-CM

## 2021-03-02 DIAGNOSIS — R0600 Dyspnea, unspecified: Secondary | ICD-10-CM

## 2021-03-02 DIAGNOSIS — I00 Rheumatic fever without heart involvement: Secondary | ICD-10-CM

## 2021-03-02 DIAGNOSIS — I340 Nonrheumatic mitral (valve) insufficiency: Secondary | ICD-10-CM

## 2021-03-02 DIAGNOSIS — M549 Dorsalgia, unspecified: Secondary | ICD-10-CM

## 2021-03-02 DIAGNOSIS — R0989 Other specified symptoms and signs involving the circulatory and respiratory systems: Secondary | ICD-10-CM

## 2021-03-02 DIAGNOSIS — H547 Unspecified visual loss: Secondary | ICD-10-CM

## 2021-03-02 DIAGNOSIS — M810 Age-related osteoporosis without current pathological fracture: Secondary | ICD-10-CM

## 2021-03-02 DIAGNOSIS — I4891 Unspecified atrial fibrillation: Secondary | ICD-10-CM

## 2021-03-02 DIAGNOSIS — R051 Acute cough: Secondary | ICD-10-CM

## 2021-03-02 DIAGNOSIS — J069 Acute upper respiratory infection, unspecified: Secondary | ICD-10-CM

## 2021-03-02 DIAGNOSIS — I272 Pulmonary hypertension, unspecified: Secondary | ICD-10-CM

## 2021-03-02 DIAGNOSIS — J431 Panlobular emphysema: Secondary | ICD-10-CM

## 2021-03-02 DIAGNOSIS — I253 Aneurysm of heart: Secondary | ICD-10-CM

## 2021-03-02 DIAGNOSIS — M1990 Unspecified osteoarthritis, unspecified site: Secondary | ICD-10-CM

## 2021-03-02 DIAGNOSIS — Z9889 Other specified postprocedural states: Secondary | ICD-10-CM

## 2021-03-02 DIAGNOSIS — E039 Hypothyroidism, unspecified: Secondary | ICD-10-CM

## 2021-03-02 DIAGNOSIS — B999 Unspecified infectious disease: Secondary | ICD-10-CM

## 2021-03-02 DIAGNOSIS — E119 Type 2 diabetes mellitus without complications: Secondary | ICD-10-CM

## 2021-03-02 DIAGNOSIS — Z7901 Long term (current) use of anticoagulants: Secondary | ICD-10-CM

## 2021-03-02 DIAGNOSIS — I5022 Chronic systolic (congestive) heart failure: Secondary | ICD-10-CM

## 2021-03-02 DIAGNOSIS — R296 Repeated falls: Secondary | ICD-10-CM

## 2021-03-02 DIAGNOSIS — I428 Other cardiomyopathies: Secondary | ICD-10-CM

## 2021-03-02 DIAGNOSIS — C539 Malignant neoplasm of cervix uteri, unspecified: Secondary | ICD-10-CM

## 2021-03-02 DIAGNOSIS — K8020 Calculus of gallbladder without cholecystitis without obstruction: Secondary | ICD-10-CM

## 2021-03-02 LAB — CBC
HEMATOCRIT: 35 % — ABNORMAL LOW (ref 36–45)
MCH: 28 pg (ref 26–34)
MCHC: 33 g/dL (ref 32.0–36.0)
MCV: 86 FL — ABNORMAL HIGH (ref 80–100)
MPV: 8.6 FL (ref 7–11)
PLATELET COUNT: 168 K/UL (ref >60)
RBC COUNT: 4.1 M/UL (ref 4.0–5.0)
RDW: 17 % — ABNORMAL HIGH (ref 11–15)
WBC COUNT: 5.2 K/UL (ref 4.5–11.0)

## 2021-03-02 LAB — BASIC METABOLIC PANEL: SODIUM: 139 MMOL/L (ref 137–147)

## 2021-03-02 LAB — MAGNESIUM: MAGNESIUM: 1.9 mg/dL — ABNORMAL LOW (ref 1.6–2.6)

## 2021-03-02 NOTE — Patient Instructions
Please talk with PAT regarding need for chest XRAY before your procedure.   To schedule an appointment call [phone].     In order to provide you the best care possible we ask that you follow up as below:    For non-urgent questions please contact us through your MyChart account.   For all medication refills please contact your pharmacy or send a request through [place].     For all questions that may need to be addressed urgently please call the nursing triage voicemail at [phone] Monday - Friday 8-5 only. Please leave a detailed message with your name, date of birth, and reason for your call.      Please allow ~ 10 business days for the results of any testing to be reviewed. Please call our office if you have not heard from a nurse within this time frame.
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
## 2021-03-02 NOTE — Progress Notes
Date of Service: 03/02/2021    Kimberly Mitchell is a 80 y.o. female.       HPI  Kimberly Mitchell was seen in the office today in electrophysiology follow up. As you may know, she is a 80 y.o. female, with past medical history including chronic?systolic and?diastolic heart failure with left ventricular hypertrophy, pulmonary hypertension, hypertension, valvular disease including mitral regurgitation, aortic insufficiency, and tricuspid regurgitation. ?Other known medical problems includes history of breast cancer, diabetes mellitus, hypothyroidism, peripheral neuropathy, atrial fibrillation, and pulmonary emboli. ?She underwent recent echo Doppler in December 2021 which revealed a worsening of her ejection fraction down to 25 to 30%, moderate concentric LVH. ?She had a heavily calcified aortic valve with low gradient and moderate regurgitation. ?She was also noted to have severe mitral regurgitation with mitral annular calcification and severe tricuspid insufficiency with a pulmonary artery systolic pressure of 72 mmHg. ?Patient underwent MitraClip placement with Dr. Elayne Snare and Dr. Dorisann Frames in April of this year.  She has continued to suffer with unsteady gait and frequent falls and was referred to Dr. Leeann Must for consideration of left atrial appendage occlusion with watchman placement.  Watchman procedure is scheduled to take place in the next few weeks and her visit today is for preoperative evaluation with history and physical.    [name] reports doing fairly well from a cardiac standpoint since her last visit.  She denies volume overload, weight gain or lower extremity edema.  She has been suffering with chilling as well as yellow productive cough for the last 2 months.  She was initiated on Z-Pak yesterday.  She does not on the thermometer and has not checked for fever.  She denies recent falls or injury.  She denies signs or symptoms of bleeding.    Vitals:    03/02/21 1328   BP: (!) 180/94   BP Source: Arm, Left Upper   Pulse: 81   O2 Device: None (Room air)   PainSc: Six   Weight: 66.2 kg (146 lb)   Height: 160 cm (5' 3)     Body mass index is 25.86 kg/m?Kimberly Mitchell     Past Medical History  Patient Active Problem List    Diagnosis Date Noted   ? Frequent falls 03/02/2021   ? Upper respiratory infection 03/02/2021   ? Atrial fibrillation (HCC) 10/13/2020   ? Postoperative anemia due to acute blood loss 09/03/2020   ? AKI (acute kidney injury) (HCC) 09/03/2020   ? Nonrheumatic mitral valve stenosis 09/03/2020   ? Chronic anticoagulation 09/03/2020     Eliquis     ? Fall at home 09/03/2020     R hip pain d/t fall preop     ? S/P mitral valve clip implantation 09/01/2020     09/01/20 - [facility]     ? Hypokalemia 09/01/2020   ? Acute on chronic combined systolic (congestive) and diastolic (congestive) heart failure (HCC) 08/30/2020   ? Moderate mitral regurgitation 08/30/2020   ? Mild CAD 08/30/2020     08/18/20: Cath by Dr. Mackey Birchwood - Mitraclip work-up  1. Left main coronary artery:  The left main coronary artery arises normally from the left coronary sinus.  The left main coronary artery has a very short course and it immediately bifurcates into left anterior descending artery and left circumflex artery.  The left anterior descending artery is free of angiographically significant disease.  2. Left anterior descending artery:  The left anterior descending artery is a large caliber vessel,  which gives rise to 2 diagonal vessels.  The mid left anterior descending artery has a focal 40% stenosis.  This is a type 1 LAD.  3. Left circumflex artery:  The left circumflex artery is a large caliber dominant vessel, which gives rise to a medium caliber obtuse marginal branch.  Then, the left circumflex artery distally gives rise to left posterolateral branches and left posterior descending artery.  The left circumflex artery as well as its branches are free of angiographically significant disease.  4. Right coronary artery:  The right coronary artery is a small caliber and nondominant vessel, which arises from the right coronary cusp.  The right coronary artery is free of angiographically significant disease.     ? 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 Chronic combined systolic (congestive) and diastolic (congestive) heart failure (HCC) 07/10/2020   ? Tricuspid regurgitation 07/10/2020   ? Dyspnea      a. 07/2019 hospitalization for worsening  SOA @ [facility] [place] [facility]      ? Pulmonary hypertension (HCC)      a. 07/20/2019 - Echo - EF 55% / PAP 64 mmHg / moderate aortic stenosis, moderate aortic insufficiency/ mitral annular calcificaiton and moderate mitral regurgitation @ [place]     ? 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 Use of letrozole (Femara) 07/14/2017   ? 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 History of breast cancer 02/10/2014   ? Carotid bruit 11/26/2012   ? 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 Aortic insufficiency 09/25/2008     Moderate     ? Fibromyalgia 09/25/2008     Follows with a rheumatologist in [place] (based out of [facility])     ? Atrial septal aneurysm 09/25/2008   ? Hypothyroidism 09/25/2008   ? Essential hypertension 07/27/2006     02/06 renal ultrasound: Normal segmental arterial wave forms in both kidneys. No signs of renal  artery stenosis.     ? Type 2 diabetes mellitus (HCC) 07/27/2006   ? Hyperlipidemia 07/27/2006         Review of Systems   Constitutional: Negative.   HENT: Negative.    Eyes: Negative.    Cardiovascular: Negative.    Respiratory: Negative.    Endocrine: Negative.    Hematologic/Lymphatic: Negative.    Skin: Negative.    Musculoskeletal: Negative.    Gastrointestinal: Negative.    Genitourinary: Negative.    Neurological: Negative.    Psychiatric/Behavioral: Negative.    Allergic/Immunologic: Negative.    All other systems reviewed and are negative.      Physical Exam Constitutional: She appears well-developed and well-nourished.   HENT:   Head: Normocephalic and atraumatic.   Eyes: Conjunctivae are normal.   Cardiovascular: Normal rate, regular rhythm and normal heart sounds.   Pulmonary/Chest: Effort normal and breath sounds normal.   Musculoskeletal:         General: No edema.   Neurological: She is alert and oriented to person, place, and time.   Skin: Skin is warm and dry.   Psychiatric: She has a normal mood and affect. Her behavior is normal.       Cardiovascular Studies  ECG in office today shows normal sinus rhythm at 81 bpm.  She does have first-degree AV block noted with PR interval estimated near 250 ms.      Cardiovascular Health Factors  Vitals BP  Readings from Last 3 Encounters:   03/02/21 (!) 180/94   11/22/20 (!) 171/85   10/26/20 (!) 196/96     Wt Readings from Last 3 Encounters:   03/02/21 66.2 kg (146 lb)   11/22/20 62.3 kg (137 lb 6.4 oz)   10/26/20 62.6 kg (138 lb)     BMI Readings from Last 3 Encounters:   03/02/21 25.86 kg/m?   11/22/20 23.58 kg/m?   10/26/20 22.96 kg/m?      Smoking Social History     Tobacco Use   Smoking Status Never Smoker   Smokeless Tobacco Never Used      Lipid Profile Cholesterol   Date Value Ref Range Status   08/17/2020 180 <200 MG/DL Final     HDL   Date Value Ref Range Status   08/17/2020 66 >40 MG/DL Final     LDL   Date Value Ref Range Status   08/17/2020 85 <100 mg/dL Final     Triglycerides   Date Value Ref Range Status   08/17/2020 86 <150 MG/DL Final      Blood Sugar Hemoglobin A1C   Date Value Ref Range Status   11/22/2020 7.4 (H) 4.0 - 6.0 % Final     Comment:     The ADA recommends that most patients with type 1 and type 2 diabetes maintain   an A1c level <7%.       Glucose   Date Value Ref Range Status   11/22/2020 198 (H) 70 - 100 MG/DL Final   32/44/0102 725 (H) 70 - 105 Final   09/25/2020 162 (H) 70 - 100 MG/DL Final     Glucose, POC   Date Value Ref Range Status   09/13/2020 150 (H) 70 - 100 MG/DL Final 36/64/4034 742 (H) 70 - 100 MG/DL Final   59/56/3875 643 (H) 70 - 100 MG/DL Final        Left atrial appendage occlusion pre and post documentation (45 days, 6 months, 1 year and 2 years):  - Pre-procedure only  - Documentation of shared decision making (2 providers):  Dr. Leeann Must, Janelle McClymont ARNP, [facility] ARNP,   - An evidence-based tool (EBT) was provided to patient to ensure that the patients? health goals and preferences were covered.  - A copy of the EBT can be accessed thru [facility] system under 'Left Atrial Appendage Occlusion (LAAO) Device Shared Decision-Making Tool'     - Modified Rankin Scale: 0  - Barthel Index Score:100  - NYHA class:III  - HAS-BLED score: 3  -     CHA2DS2-VASc Score: 5 (female, age x 2, DM, HF)    Problems Addressed Today  Encounter Diagnoses   Name Primary?   ? Cardiovascular symptoms Yes   ? Essential hypertension    ? Mild CAD    ? Acute cough    ? Nonrheumatic aortic valve insufficiency    ? Paroxysmal atrial fibrillation (HCC)    ? Chronic combined systolic (congestive) and diastolic (congestive) heart failure (HCC)    ? Mixed hyperlipidemia    ? Moderate mitral regurgitation    ? S/P mitral valve clip implantation    ? Pulmonary hypertension (HCC)    ? Nonrheumatic tricuspid valve regurgitation    ? Chronic anticoagulation    ? Type 2 diabetes mellitus without complication, without long-term current use of insulin (HCC)    ? Frequent falls    ? Upper respiratory tract infection, unspecified type  Assessment and Plan  Ms. Satterlee is suffering with an upper respiratory infection and just recently started on Z-pak yesterday.  I would like to check a CXR to rule out pneumonia.  I have ordered today and she is scheduled to undergo evaluation in the preanesthesia testing clinic immediately following her visit.  Assuming chest x-ray looks okay and she continues to recover with Z-Pak, I think it is safe to proceed with Watchman implant as planned.  We will make further recommendations pending her procedure and hospital course.    Jene Every, NP-C           Current Medications (including today's revisions)  ? acetaminophen (TYLENOL) 325 mg tablet Take two tablets by mouth every 6 hours as needed.   ? albuterol sulfate (PROAIR HFA) 90 mcg/actuation HFA aerosol inhaler Inhale two puffs by mouth into the lungs every 6 hours as needed for Wheezing.   ? aspirin 81 mg chewable tablet Chew one tablet by mouth daily. Take with food.  Indications: stroke prevention   ? atorvastatin (LIPITOR) 20 mg tablet Take one tablet by mouth daily.   ? blood sugar diagnostic (BLOOD GLUCOSE TEST) test strip Use one strip as directed daily before breakfast. Diagnosis Code: E11.65  Indications: type 2 diabetes mellitus   ? bumetanide (BUMEX) 2 mg tablet Take one-half tablet by mouth daily.   ? carvediloL (COREG) 25 mg tablet Take one-half tablet by mouth twice daily.   ? dapagliflozin (FARXIGA) 10 mg tablet Take one tablet by mouth daily.   ? ELIQUIS 5 mg tablet Take 1 tablet by mouth twice daily.   ? glimepiride (AMARYL) 2 mg tablet Take 1 tablet by mouth daily.   ? ibuprofen (ADVIL) 200 mg tablet Take 200 mg by mouth every 6 hours as needed.   ? lancets MISC Use one each as directed daily as needed. Diag Code: E11.65   ? levothyroxine (SYNTHROID) 150 mcg tablet Take one tablet by mouth daily 30 minutes before breakfast.   ? losartan (COZAAR) 100 mg tablet Take 1.5 tablets by mouth daily.   ? metFORMIN-XR (GLUCOPHAGE XR) 500 mg extended release tablet Take 500 mg by mouth daily.   ? ONETOUCH VERIO METER kit Use one strip as directed before meals and at bedtime. Diagnosis Code: E11.65   ? spironolactone (ALDACTONE) 25 mg tablet Take one tablet by mouth daily. Take with food.   ? traMADoL (ULTRAM) 50 mg tablet Take 1 tablet by mouth as Needed.

## 2021-03-06 ENCOUNTER — Encounter: Admit: 2021-03-06 | Discharge: 2021-03-06 | Payer: BC Managed Care – PPO

## 2021-03-06 NOTE — Telephone Encounter
RE: elevated BP  Received: Starleen Blue, Kathleen Argue, MD  Renne Crigler, RN  Can we have her take her coreg on the day of the procedure. No Losartan.          ----- Message -----   From: Katrine Coho, APRN-NP   Sent: 03/02/2021  5:52 PM CDT   To: Rich Reining, APRN-NP, *   Subject: elevated BP                      Dr. Reddy/Ms.West Bali,     I evaluated Kimberly Mitchell today in [facility] in anticipation of watchman procedure on 03/14/2021. Her blood pressure in our clinic had significantly elevated following her appointment with EP. BP for [place] was 211/106. Two hours prior her BP was 180/94. She had not taken any of her blood pressure medication today. She did not have any concerning symptoms. Her husband was going to verify she takes her blood pressure meds as soon as she got home. They were instructed on symptoms for which to return to ER.      On the day of her procedure, she is scheduled to hold all of her BP meds and diuretics. My concern is that her BP will be as elevated on day of procedure as it was today as she will not have taken any of her BP meds.     I wanted to bring this to your attention in case you wanted her to take her antihypertensives differently for DOS or adjust medications.       Thanks,   Olene Floss, APRN

## 2021-03-06 NOTE — Telephone Encounter
Attempted to contact patient, but no answer. [facility].

## 2021-03-14 ENCOUNTER — Encounter: Admit: 2021-03-14 | Discharge: 2021-03-14 | Payer: BC Managed Care – PPO

## 2021-03-14 NOTE — Telephone Encounter
-----   Message from Monna Fam, RN sent at 03/14/2021  6:55 AM CDT -----  Regarding: patient want to postpone  Patient left me a voicemail yesterday evening saying she is going to postpone her Watchman/Amulet procedure with Dr Reece Levy that is scheduled for 11/02.      Please call patient so this can be rescheduled and send scheduling a case message so we can remove this case from the schedule.    Thanks    [employer]

## 2021-03-14 NOTE — Telephone Encounter
Called to confirm that patient wanted to postpone her procedure.     RN called and spoke to EP lab and they stated they were aware.

## 2021-05-25 ENCOUNTER — Encounter: Admit: 2021-05-25 | Discharge: 2021-05-25 | Payer: BC Managed Care – PPO

## 2021-06-08 ENCOUNTER — Encounter: Admit: 2021-06-08 | Discharge: 2021-06-08 | Payer: BC Managed Care – PPO

## 2021-06-08 NOTE — Telephone Encounter
Called patient to reschedule Watchman procedure with Dr. Reece Levy.  LM for patient to call

## 2021-07-28 IMAGING — CT PE(Adult)
2 of 5 series · 14 of 36 positions shown · non-contrast
Comparison: none

[Series 8: cta pulmonary cor 2.00 bv36 s3 · coronal · 0.58mm/px · 3 of 92 slices shown]
[im 19/92  mediastinal]
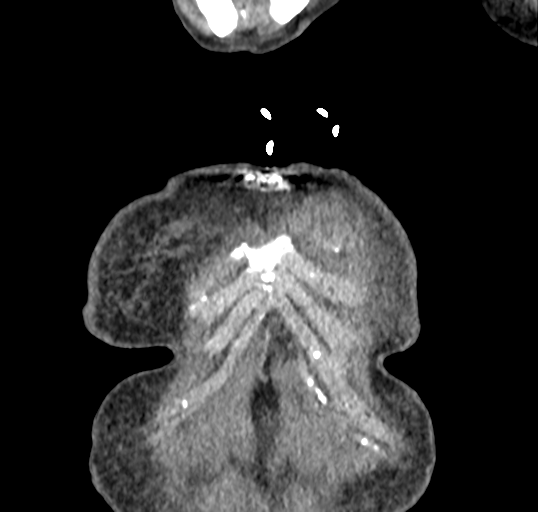
[im 37/92  mediastinal]
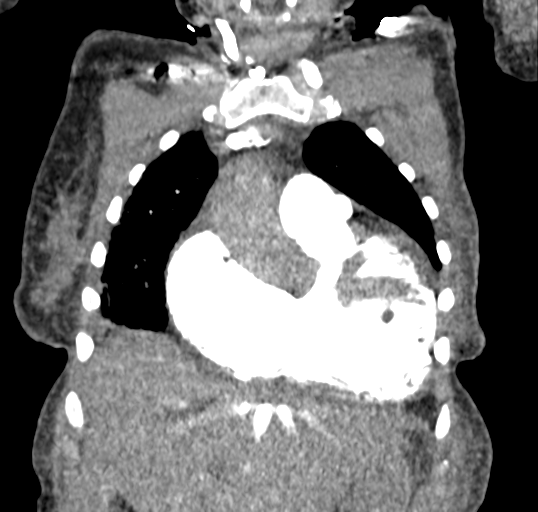
[im 55/92  mediastinal]
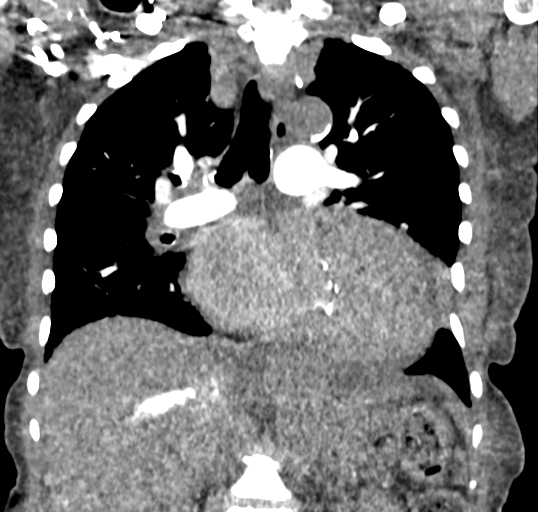

[Series 14: cta pulmonary ax 1.50 br60 s3 · axial · 0.52mm/px · z∈[+1886,+2137]mm · 11 of 155 slices shown]
[im 13/155  lung]
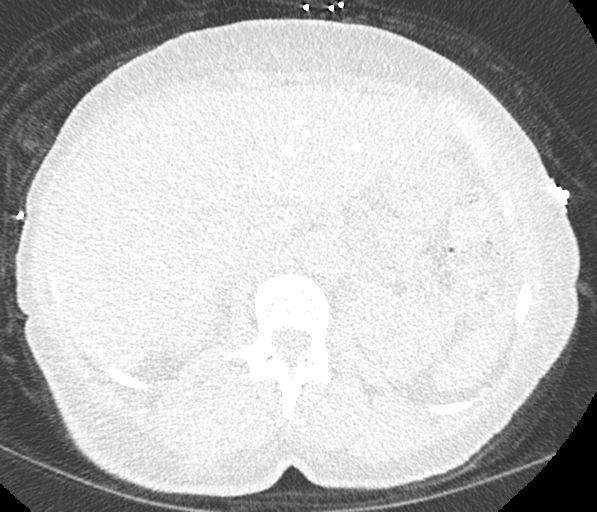
[im 26/155  mediastinal]
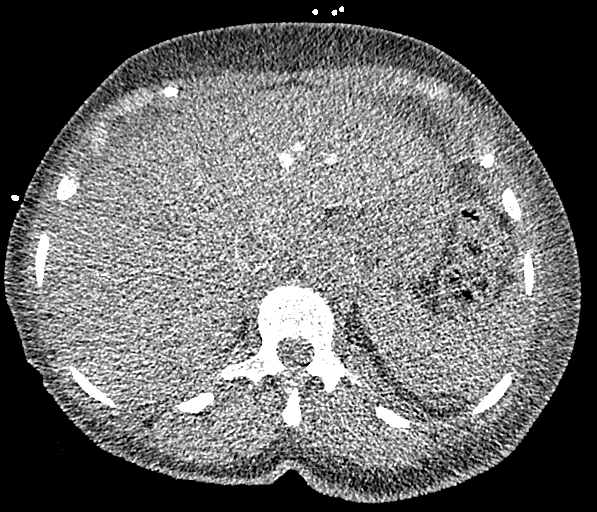
[im 39/155  lung]
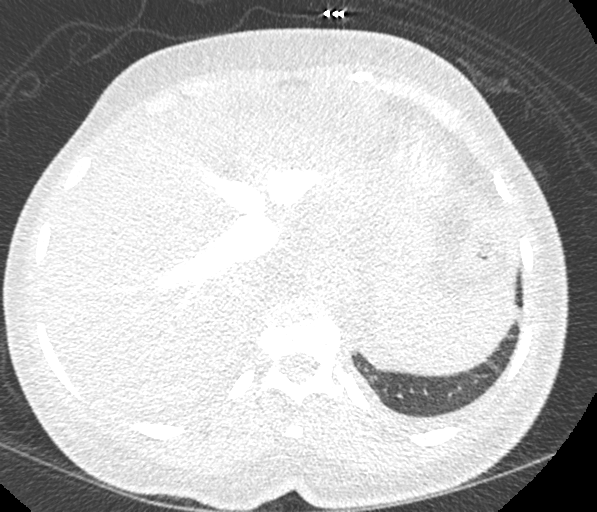
[im 52/155  mediastinal]
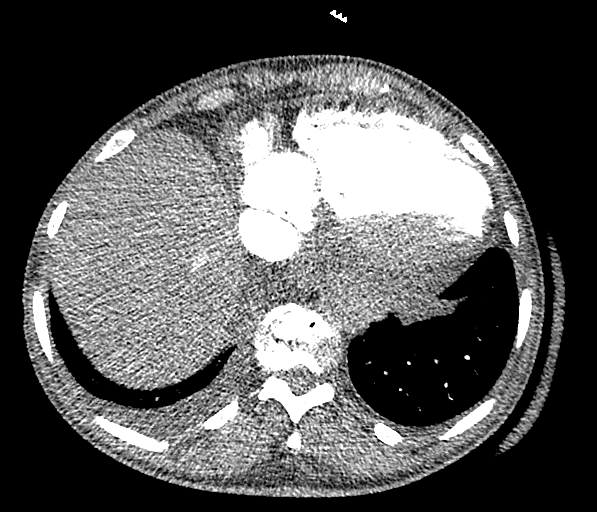
[im 65/155  lung]
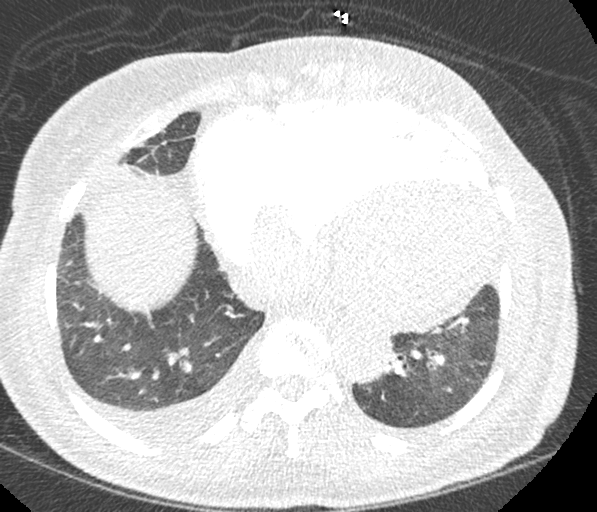
[im 78/155  mediastinal]
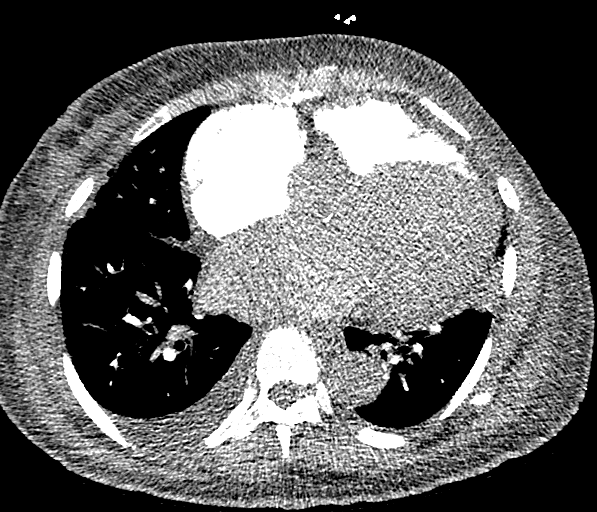
[im 90/155  lung]
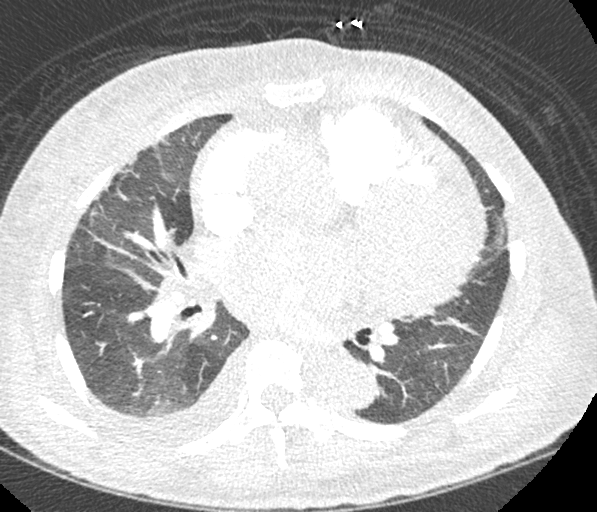
[im 103/155  mediastinal]
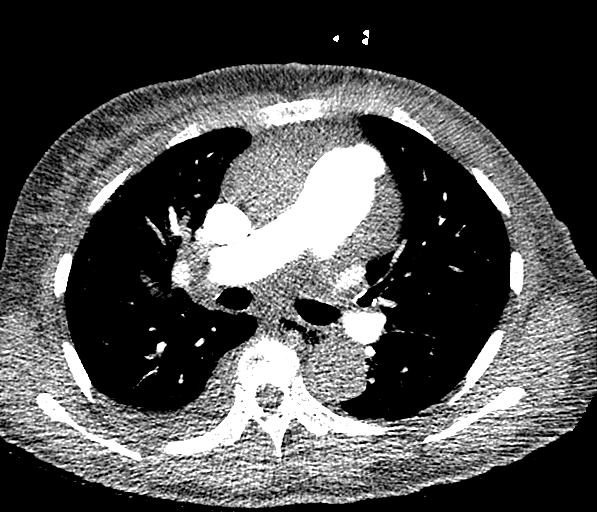
[im 116/155  lung]
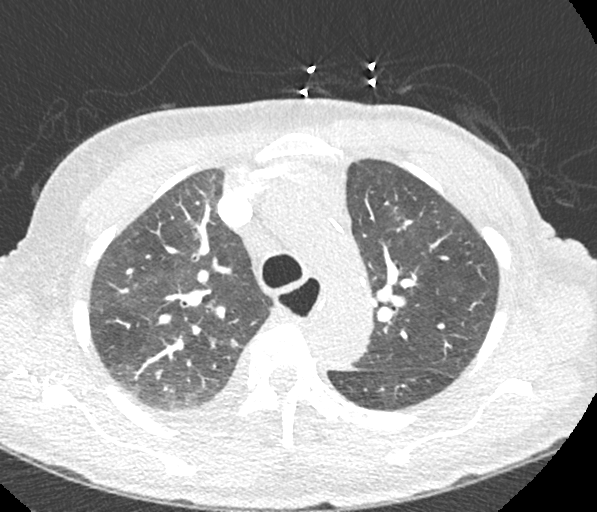
[im 129/155  mediastinal]
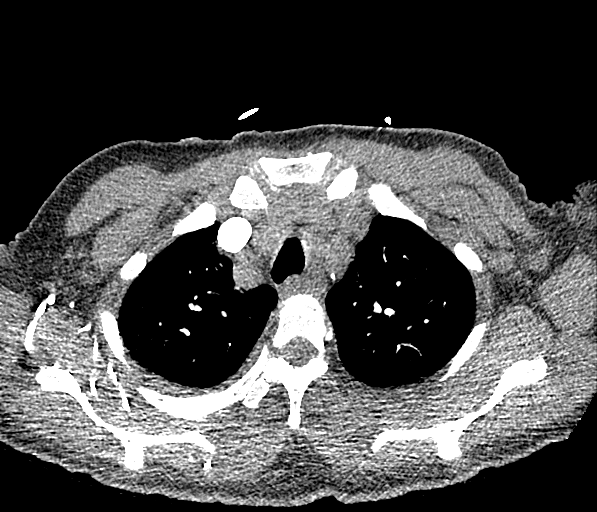
[im 142/155  lung]
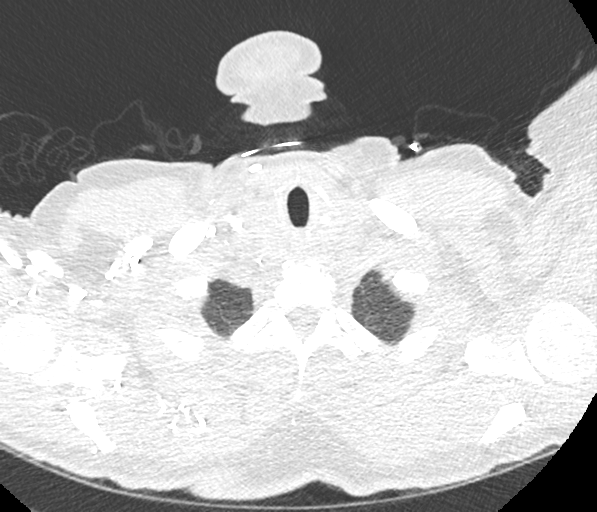

[14 of 36 positions shown; findings below may reference images not displayed]

All CT scans at this facility use dose modulation, iterative reconstruction, and/or weight based
dosing when appropriate to reduce radiation dose to as low as reasonably achievable.

[No] previous CT scans or nuclear medicine cardiac perfusion studies have been performed in the
last 12 months.

EXAM

CT pulmonary angiogram

INDICATION

dyspnea
PT C/O SOA. ELEVATED D-DIMER. HX OF MASTECTOMY AND PE. GFR 37ML [id number]. CT/NM 0/0. AK

FINDINGS

The heart is markedly enlarged.

Contrast opacifies the pulmonary arteries. No filling defects are identified. There is no evidence
to suggest pulmonary emboli.

The thoracic aorta does not appear enlarged

Gross adenopathy is not seen.

There is reflux into the inferior vena cava and hepatic veins suggesting right heart dysfunction.

Bilateral pleural effusions are present.

There is mild peribronchial thickening. There is pleural thickening. Faint ground-glass infiltrates
are present.

Images through the extreme upper abdomen are otherwise grossly unremarkable.

IMPRESSION

There is no evidence of pulmonary emboli.

There is significant cardiomegaly with evidence of right heart dysfunction. Pleural effusions are
noted.

Tech Notes:

PT C/O SOA. ELEVATED D-DIMER. HX OF MASTECTOMY AND PE. GFR 37ML [id number]. CT/NM 0/0. AK

## 2021-08-27 ENCOUNTER — Encounter: Admit: 2021-08-27 | Discharge: 2021-08-27 | Payer: BC Managed Care – PPO

## 2021-08-27 DIAGNOSIS — Z9889 Other specified postprocedural states: Secondary | ICD-10-CM

## 2021-08-27 DIAGNOSIS — I10 Essential (primary) hypertension: Secondary | ICD-10-CM

## 2021-08-30 ENCOUNTER — Inpatient Hospital Stay: Admit: 2021-08-30 | Payer: BC Managed Care – PPO

## 2021-08-30 ENCOUNTER — Encounter: Admit: 2021-08-30 | Discharge: 2021-08-30 | Payer: BC Managed Care – PPO

## 2021-08-30 DIAGNOSIS — I5023 Acute on chronic systolic (congestive) heart failure: Secondary | ICD-10-CM

## 2021-08-30 DIAGNOSIS — I509 Heart failure, unspecified: Secondary | ICD-10-CM

## 2021-08-30 LAB — COMPREHENSIVE METABOLIC PANEL
ALBUMIN: 3.8 g/dL (ref 3.5–5.0)
ALK PHOSPHATASE: 85 U/L (ref 25–110)
ALT: 26 U/L (ref 7–56)
ANION GAP: 9 K/UL (ref 3–12)
AST: 43 U/L — ABNORMAL HIGH (ref 7–40)
BLD UREA NITROGEN: 14 mg/dL (ref 7–25)
CALCIUM: 9.2 mg/dL — ABNORMAL HIGH (ref 8.5–10.6)
CHLORIDE: 104 MMOL/L — ABNORMAL LOW (ref 98–110)
CO2: 25 MMOL/L (ref 21–30)
CREATININE: 0.9 mg/dL (ref 0.4–1.00)
EGFR: >60 mL/min (ref >60)
GLUCOSE,PANEL: 266 mg/dL — ABNORMAL HIGH (ref 70–100)
SODIUM: 138 MMOL/L — ABNORMAL LOW (ref 137–147)
TOTAL BILIRUBIN: 0.6 mg/dL (ref 0.3–1.2)
TOTAL PROTEIN: 7.4 g/dL (ref 6.0–8.0)

## 2021-08-30 LAB — CBC AND DIFF
ABSOLUTE BASO COUNT: 0 K/UL (ref 0–0.20)
ABSOLUTE EOS COUNT: 0.1 K/UL (ref 0–0.45)
ABSOLUTE MONO COUNT: 0.2 K/UL (ref 0–0.80)
MDW (MONOCYTE DISTRIBUTION WIDTH): 17 (ref <20.7)
WBC COUNT: 4 K/UL — ABNORMAL LOW (ref 4.5–11.0)

## 2021-08-30 LAB — HIGH SENSITIVITY TROPONIN I 0 HOUR: HIGH SENSITIVITY TROPONIN I 0 HOUR: 22 ng/L — ABNORMAL HIGH (ref <12)

## 2021-08-30 LAB — BNP POC ER: BNP POC: 496 pg/mL — ABNORMAL HIGH (ref 0–100)

## 2021-08-30 LAB — MAGNESIUM: MAGNESIUM: 2 mg/dL — ABNORMAL LOW (ref 1.6–2.6)

## 2021-08-30 LAB — HIGH SENSITIVITY TROPONIN I 2 HOUR
HIGH SENSITIVITY TROPONIN I 2 HOUR: 23 ng/L — ABNORMAL HIGH (ref <12)
HIGH SENSITIVITY TROPONIN I DELTA VALUE: 1

## 2021-08-30 MED ORDER — LEVOTHYROXINE 150 MCG PO TAB
150 ug | Freq: Every day | ORAL | 0 refills | Status: AC
Start: 2021-08-30 — End: ?
  Administered 2021-08-31 – 2021-09-05 (×6): 150 ug via ORAL

## 2021-08-30 MED ORDER — ALBUTEROL SULFATE 90 MCG/ACTUATION IN HFAA
2 | RESPIRATORY_TRACT | 0 refills | Status: AC | PRN
Start: 2021-08-30 — End: ?

## 2021-08-30 MED ORDER — NITROGLYCERIN 0.4 MG SL SUBL
.4 mg | Freq: Once | SUBLINGUAL | 0 refills | Status: AC
Start: 2021-08-30 — End: ?

## 2021-08-30 MED ORDER — SPIRONOLACTONE 25 MG PO TAB
25 mg | Freq: Every day | ORAL | 0 refills | Status: DC
Start: 2021-08-30 — End: 2021-08-31

## 2021-08-30 MED ORDER — ATORVASTATIN 10 MG PO TAB
20 mg | Freq: Every day | ORAL | 0 refills | Status: DC
Start: 2021-08-30 — End: 2021-08-31

## 2021-08-30 MED ORDER — LOSARTAN 50 MG PO TAB
100 mg | Freq: Every day | ORAL | 0 refills | Status: DC
Start: 2021-08-30 — End: 2021-08-31

## 2021-08-30 MED ORDER — ACETAMINOPHEN 325 MG PO TAB
650 mg | ORAL | 0 refills | Status: AC | PRN
Start: 2021-08-30 — End: ?
  Administered 2021-09-03 – 2021-09-05 (×3): 650 mg via ORAL

## 2021-08-30 MED ORDER — ASPIRIN 81 MG PO CHEW
81 mg | Freq: Every day | ORAL | 0 refills | Status: AC
Start: 2021-08-30 — End: ?
  Administered 2021-08-31 – 2021-09-05 (×6): 81 mg via ORAL

## 2021-08-30 MED ORDER — BUMETANIDE 0.25 MG/ML IJ SOLN
2 mg | Freq: Once | INTRAVENOUS | 0 refills | Status: CP
Start: 2021-08-30 — End: ?
  Administered 2021-08-31: 04:00:00 2 mg via INTRAVENOUS

## 2021-08-31 ENCOUNTER — Encounter: Admit: 2021-08-31 | Discharge: 2021-08-31 | Payer: BC Managed Care – PPO

## 2021-08-31 ENCOUNTER — Encounter: Admit: 2021-08-31 | Discharge: 2021-08-30 | Payer: BC Managed Care – PPO

## 2021-08-31 ENCOUNTER — Emergency Department: Admit: 2021-08-31 | Discharge: 2021-08-30 | Payer: BC Managed Care – PPO

## 2021-08-31 ENCOUNTER — Inpatient Hospital Stay: Admit: 2021-08-31 | Discharge: 2021-08-31 | Payer: BC Managed Care – PPO

## 2021-08-31 MED ORDER — ONDANSETRON HCL (PF) 4 MG/2 ML IJ SOLN
4 mg | INTRAVENOUS | 0 refills | Status: AC | PRN
Start: 2021-08-31 — End: ?

## 2021-08-31 MED ORDER — SENNOSIDES-DOCUSATE SODIUM 8.6-50 MG PO TAB
1 | Freq: Every day | ORAL | 0 refills | Status: AC | PRN
Start: 2021-08-31 — End: ?
  Administered 2021-09-05: 16:00:00 1 via ORAL

## 2021-08-31 MED ORDER — POLYETHYLENE GLYCOL 3350 17 GRAM PO PWPK
1 | Freq: Every day | ORAL | 0 refills | Status: AC | PRN
Start: 2021-08-31 — End: ?
  Administered 2021-09-05: 16:00:00 17 g via ORAL

## 2021-08-31 MED ORDER — ENOXAPARIN 40 MG/0.4 ML SC SYRG
40 mg | Freq: Every day | SUBCUTANEOUS | 0 refills | Status: AC
Start: 2021-08-31 — End: ?
  Administered 2021-08-31 – 2021-09-02 (×3): 40 mg via SUBCUTANEOUS

## 2021-08-31 MED ORDER — MELATONIN 5 MG PO TAB
5 mg | Freq: Every evening | ORAL | 0 refills | Status: AC | PRN
Start: 2021-08-31 — End: ?

## 2021-08-31 MED ORDER — ONDANSETRON 4 MG PO TBDI
4 mg | ORAL | 0 refills | Status: AC | PRN
Start: 2021-08-31 — End: ?

## 2021-08-31 MED ADMIN — SODIUM CHLORIDE 0.9 % IV SOLP [27838]: 250 mL | INTRAVENOUS | @ 19:00:00 | Stop: 2021-08-31 | NDC 00338004902

## 2021-08-31 MED ADMIN — BUMETANIDE 0.25 MG/ML IJ SOLN [9308]: 1 mg | INTRAVENOUS | @ 22:00:00 | NDC 70860040541

## 2021-08-31 MED ADMIN — BUMETANIDE 0.25 MG/ML IJ SOLN [9308]: 1 mg | INTRAVENOUS | @ 13:00:00 | NDC 72205010101

## 2021-08-31 MED ADMIN — HYDRALAZINE 25 MG PO TAB [3700]: 25 mg | ORAL | @ 19:00:00 | NDC 00904644161

## 2021-08-31 MED ADMIN — MAGNESIUM SULFATE IN D5W 1 GRAM/100 ML IV PGBK [166578]: 1 g | INTRAVENOUS | @ 19:00:00 | Stop: 2021-08-31 | NDC 00338170940

## 2021-08-31 MED ADMIN — POTASSIUM CHLORIDE 20 MEQ PO TBTQ [35943]: 60 meq | ORAL | @ 19:00:00 | Stop: 2021-08-31 | NDC 00832532511

## 2021-08-31 MED ADMIN — ATORVASTATIN 10 MG PO TAB [77736]: 20 mg | ORAL | @ 05:00:00 | NDC 00904629061

## 2021-08-31 MED ADMIN — CARVEDILOL 12.5 MG PO TAB [77424]: 12.5 mg | ORAL | @ 13:00:00 | NDC 00904630261

## 2021-08-31 MED ADMIN — CARVEDILOL 12.5 MG PO TAB [77424]: 12.5 mg | ORAL | @ 06:00:00 | NDC 00904630261

## 2021-08-31 MED ADMIN — POTASSIUM CHLORIDE 20 MEQ PO TBTQ [35943]: 60 meq | ORAL | @ 15:00:00 | Stop: 2021-08-31 | NDC 00832532511

## 2021-08-31 MED ADMIN — INSULIN ASPART 100 UNIT/ML SC FLEXPEN [87504]: 4 [IU] | SUBCUTANEOUS | @ 14:00:00 | Stop: 2021-08-31 | NDC 00169633910

## 2021-08-31 MED ADMIN — MAGNESIUM SULFATE IN D5W 1 GRAM/100 ML IV PGBK [166578]: 1 g | INTRAVENOUS | @ 23:00:00 | Stop: 2021-09-01 | NDC 00338170940

## 2021-08-31 MED ADMIN — SPIRONOLACTONE 25 MG PO TAB [7437]: 25 mg | ORAL | @ 05:00:00 | NDC 00904692761

## 2021-08-31 MED ADMIN — LOSARTAN 50 MG PO TAB [76938]: 100 mg | ORAL | @ 05:00:00 | NDC 68084034711

## 2021-08-31 MED ADMIN — HYDRALAZINE 25 MG PO TAB [3700]: 25 mg | ORAL | @ 13:00:00 | NDC 00904644161

## 2021-08-31 MED ADMIN — HYDRALAZINE 25 MG PO TAB [3700]: 25 mg | ORAL | @ 06:00:00 | NDC 00904644161

## 2021-08-31 NOTE — ED Notes
Pt HR acutely changing to sinus tach at rate of 145-150 after obtaining prior 12-lead with HR in 80's. New EKG to be obtained.

## 2021-09-01 ENCOUNTER — Encounter: Admit: 2021-09-01 | Discharge: 2021-09-01 | Payer: BC Managed Care – PPO

## 2021-09-01 MED ADMIN — CARVEDILOL 12.5 MG PO TAB [77424]: 12.5 mg | ORAL | @ 01:00:00 | NDC 00904630261

## 2021-09-01 MED ADMIN — CARVEDILOL 12.5 MG PO TAB [77424]: 12.5 mg | ORAL | @ 14:00:00 | NDC 00904630261

## 2021-09-01 MED ADMIN — BUMETANIDE 0.25 MG/ML IJ SOLN [9308]: 1 mg | INTRAVENOUS | @ 14:00:00 | NDC 70860040541

## 2021-09-01 MED ADMIN — BUMETANIDE 0.25 MG/ML IJ SOLN [9308]: 1 mg | INTRAVENOUS | @ 21:00:00 | NDC 72205010101

## 2021-09-01 MED ADMIN — HYDRALAZINE 25 MG PO TAB [3700]: 25 mg | ORAL | @ 14:00:00 | NDC 00904644161

## 2021-09-01 MED ADMIN — SPIRONOLACTONE 25 MG PO TAB [7437]: 25 mg | ORAL | @ 14:00:00 | NDC 00904692761

## 2021-09-01 MED ADMIN — ATORVASTATIN 10 MG PO TAB [77736]: 20 mg | ORAL | @ 14:00:00 | NDC 00904629061

## 2021-09-01 MED ADMIN — HYDRALAZINE 25 MG PO TAB [3700]: 25 mg | ORAL | @ 21:00:00 | NDC 00904644161

## 2021-09-01 MED ADMIN — HYDRALAZINE 25 MG PO TAB [3700]: 25 mg | ORAL | @ 01:00:00 | NDC 00904644161

## 2021-09-01 MED ADMIN — LOSARTAN 50 MG PO TAB [76938]: 100 mg | ORAL | @ 14:00:00 | NDC 68084034711

## 2021-09-02 ENCOUNTER — Inpatient Hospital Stay: Admit: 2021-09-02 | Discharge: 2021-09-02 | Payer: BC Managed Care – PPO

## 2021-09-02 MED ADMIN — BUMETANIDE 0.25 MG/ML IJ SOLN [9308]: 1 mg | INTRAVENOUS | @ 15:00:00 | NDC 68462046940

## 2021-09-02 MED ADMIN — CARVEDILOL 12.5 MG PO TAB [77424]: 12.5 mg | ORAL | @ 15:00:00 | NDC 00904630261

## 2021-09-02 MED ADMIN — APIXABAN 5 MG PO TAB [315778]: 5 mg | ORAL | @ 15:00:00 | NDC 00003089431

## 2021-09-02 MED ADMIN — HYDRALAZINE 25 MG PO TAB [3700]: 25 mg | ORAL | @ 15:00:00 | NDC 00904644161

## 2021-09-02 MED ADMIN — CARVEDILOL 12.5 MG PO TAB [77424]: 12.5 mg | ORAL | @ 02:00:00 | NDC 00904630261

## 2021-09-02 MED ADMIN — HYDRALAZINE 25 MG PO TAB [3700]: 25 mg | ORAL | @ 02:00:00 | NDC 00904644161

## 2021-09-02 MED ADMIN — LACTATED RINGERS IV SOLP [4318]: 250 mL | INTRAVENOUS | @ 18:00:00 | Stop: 2021-09-02 | NDC 00338011704

## 2021-09-02 MED ADMIN — ATORVASTATIN 10 MG PO TAB [77736]: 20 mg | ORAL | @ 15:00:00 | NDC 00904629061

## 2021-09-02 MED ADMIN — SPIRONOLACTONE 25 MG PO TAB [7437]: 25 mg | ORAL | @ 15:00:00 | NDC 00904692761

## 2021-09-02 MED ADMIN — LOSARTAN 50 MG PO TAB [76938]: 100 mg | ORAL | @ 15:00:00 | NDC 68084034711

## 2021-09-03 MED ADMIN — APIXABAN 5 MG PO TAB [315778]: 5 mg | ORAL | @ 14:00:00 | NDC 00003089431

## 2021-09-03 MED ADMIN — LOSARTAN 50 MG PO TAB [76938]: 100 mg | ORAL | @ 14:00:00 | NDC 68084034711

## 2021-09-03 MED ADMIN — APIXABAN 5 MG PO TAB [315778]: 5 mg | ORAL | @ 01:00:00 | NDC 00003089431

## 2021-09-03 MED ADMIN — HYDRALAZINE 25 MG PO TAB [3700]: 25 mg | ORAL | @ 21:00:00 | NDC 00904644161

## 2021-09-03 MED ADMIN — ATORVASTATIN 10 MG PO TAB [77736]: 20 mg | ORAL | @ 14:00:00 | NDC 00904629061

## 2021-09-03 MED ADMIN — BUMETANIDE 2 MG PO TAB [9311]: 2 mg | ORAL | @ 18:00:00 | NDC 50268013211

## 2021-09-03 MED ADMIN — CARVEDILOL 12.5 MG PO TAB [77424]: 12.5 mg | ORAL | @ 01:00:00 | NDC 00904630261

## 2021-09-03 MED ADMIN — SPIRONOLACTONE 25 MG PO TAB [7437]: 25 mg | ORAL | @ 14:00:00 | NDC 00904692761

## 2021-09-03 MED ADMIN — CARVEDILOL 12.5 MG PO TAB [77424]: 12.5 mg | ORAL | @ 14:00:00 | Stop: 2021-09-03 | NDC 00904630261

## 2021-09-04 ENCOUNTER — Inpatient Hospital Stay: Admit: 2021-09-04 | Discharge: 2021-09-04 | Payer: BC Managed Care – PPO

## 2021-09-04 MED ADMIN — HYDRALAZINE 25 MG PO TAB [3700]: 25 mg | ORAL | @ 02:00:00 | NDC 00904644161

## 2021-09-04 MED ADMIN — SPIRONOLACTONE 25 MG PO TAB [7437]: 25 mg | ORAL | @ 13:00:00 | NDC 00904692761

## 2021-09-04 MED ADMIN — APIXABAN 5 MG PO TAB [315778]: 5 mg | ORAL | @ 02:00:00 | NDC 00003089431

## 2021-09-04 MED ADMIN — IRON SUCROSE 100 MG IRON/5 ML IV SOLN GROUP [280019]: 300 mg | INTRAVENOUS | @ 22:00:00 | Stop: 2021-09-07 | NDC 00517234001

## 2021-09-04 MED ADMIN — CARVEDILOL 6.25 MG PO TAB [77309]: 6.25 mg | ORAL | @ 02:00:00 | NDC 00904630161

## 2021-09-04 MED ADMIN — HYDRALAZINE 25 MG PO TAB [3700]: 25 mg | ORAL | @ 13:00:00 | NDC 00904644161

## 2021-09-04 MED ADMIN — LOSARTAN 50 MG PO TAB [76938]: 100 mg | ORAL | @ 13:00:00 | NDC 68084034711

## 2021-09-04 MED ADMIN — BUMETANIDE 2 MG PO TAB [9311]: 2 mg | ORAL | @ 22:00:00 | NDC 50268013211

## 2021-09-04 MED ADMIN — APIXABAN 5 MG PO TAB [315778]: 5 mg | ORAL | @ 13:00:00 | NDC 00003089431

## 2021-09-04 MED ADMIN — HYDRALAZINE 25 MG PO TAB [3700]: 25 mg | ORAL | @ 21:00:00 | NDC 00904644161

## 2021-09-04 MED ADMIN — SODIUM CHLORIDE 0.9 % IV SOLP [27838]: 300 mg | INTRAVENOUS | @ 22:00:00 | Stop: 2021-09-07 | NDC 00338004902

## 2021-09-04 MED ADMIN — CARVEDILOL 6.25 MG PO TAB [77309]: 6.25 mg | ORAL | @ 13:00:00 | NDC 00904630161

## 2021-09-04 MED ADMIN — ATORVASTATIN 10 MG PO TAB [77736]: 20 mg | ORAL | @ 13:00:00 | NDC 00904629061

## 2021-09-04 MED ADMIN — BUMETANIDE 2 MG PO TAB [9311]: 2 mg | ORAL | @ 13:00:00 | Stop: 2021-09-04 | NDC 50268013211

## 2021-09-05 ENCOUNTER — Encounter: Admit: 2021-09-05 | Discharge: 2021-09-05 | Payer: BC Managed Care – PPO

## 2021-09-05 MED ADMIN — BUMETANIDE 2 MG PO TAB [9311]: 2 mg | ORAL | @ 14:00:00 | Stop: 2021-09-05 | NDC 50268013211

## 2021-09-05 MED ADMIN — ATORVASTATIN 10 MG PO TAB [77736]: 20 mg | ORAL | @ 14:00:00 | Stop: 2021-09-05 | NDC 00904629061

## 2021-09-05 MED ADMIN — CARVEDILOL 6.25 MG PO TAB [77309]: 6.25 mg | ORAL | @ 03:00:00 | NDC 00904630161

## 2021-09-05 MED ADMIN — CARVEDILOL 6.25 MG PO TAB [77309]: 6.25 mg | ORAL | @ 14:00:00 | Stop: 2021-09-05 | NDC 00904630161

## 2021-09-05 MED ADMIN — HYDRALAZINE 25 MG PO TAB [3700]: 25 mg | ORAL | @ 14:00:00 | Stop: 2021-09-05 | NDC 00904644161

## 2021-09-05 MED ADMIN — IRON SUCROSE 100 MG IRON/5 ML IV SOLN GROUP [280019]: 300 mg | INTRAVENOUS | @ 13:00:00 | Stop: 2021-09-05 | NDC 00517234001

## 2021-09-05 MED ADMIN — APIXABAN 5 MG PO TAB [315778]: 5 mg | ORAL | @ 14:00:00 | Stop: 2021-09-05 | NDC 00003089431

## 2021-09-05 MED ADMIN — SODIUM CHLORIDE 0.9 % IV SOLP [27838]: 300 mg | INTRAVENOUS | @ 13:00:00 | Stop: 2021-09-05 | NDC 00338004902

## 2021-09-05 MED ADMIN — POTASSIUM CHLORIDE 20 MEQ PO TBTQ [35943]: 40 meq | ORAL | @ 16:00:00 | Stop: 2021-09-05 | NDC 00832532511

## 2021-09-05 MED ADMIN — POTASSIUM CHLORIDE 20 MEQ PO TBTQ [35943]: 20 meq | ORAL | @ 14:00:00 | Stop: 2021-09-05 | NDC 00832532511

## 2021-09-05 MED ADMIN — HYDRALAZINE 25 MG PO TAB [3700]: 25 mg | ORAL | @ 20:00:00 | Stop: 2021-09-05 | NDC 00904644161

## 2021-09-05 MED ADMIN — LOSARTAN 50 MG PO TAB [76938]: 100 mg | ORAL | @ 14:00:00 | Stop: 2021-09-05 | NDC 68084034711

## 2021-09-05 MED ADMIN — SPIRONOLACTONE 25 MG PO TAB [7437]: 25 mg | ORAL | @ 14:00:00 | Stop: 2021-09-05 | NDC 00904692761

## 2021-09-05 MED ADMIN — HYDRALAZINE 25 MG PO TAB [3700]: 25 mg | ORAL | @ 03:00:00 | NDC 00904644161

## 2021-09-05 MED ADMIN — APIXABAN 5 MG PO TAB [315778]: 5 mg | ORAL | @ 03:00:00 | NDC 00003089431

## 2021-09-06 ENCOUNTER — Encounter: Admit: 2021-09-06 | Discharge: 2021-09-06 | Payer: BC Managed Care – PPO

## 2021-09-06 NOTE — Telephone Encounter
Patient Discharge Date from hospital: 09/05/21  Date Call Attempted: 09/06/21  Number of Attempts: 1  Date Call Completed:  09/06/21    Two Patient Identifier complete: Yes [x]     Next Appointment    Next follow-up appointment on 09/11/21 at 1400 with Inge Rise, APRN.     Transportation    Does pt have transportation?  Yes [x]     No []    NA []      Home Health    [facility] Ph) [phone] Fax) [phone]    Medications    Does pt have all medications? Yes  [x]     No []     Reviewed administration for Bumex and Hydralazine.    START taking:  hydrALAZINE (APRESOLINE)  CHANGE how you take:  bumetanide (BUMEX)  carvediloL (COREG)  STOP taking:  metOLazone 2.5 mg tablet (ZAROXOLYN)    Diet    200 mg cholesterol, 2 G Na    Is patient following prescribed diet and restrictions?  Yes [x]    No []      Scale/Weight    Does pt have a scale at home?  Yes []    No [x]     Did pt weight first thing this morning?  Yes []    No [x]      If yes, what was pt's first morning weight today?   Education provided    Signs and Symptoms    Pt reports the following symptoms:     No BLE edema or upper abdominal bloating/tightness. States  SOA is better since admission. Pt is wearing compression stockings.   Patient does report pain to her right knee.  States she has generalized pain all the time.  Advised patient to contact PCP if knee pain does not improve or worsens.    Pt verbalized understanding of signs and symptoms of HF and when to contact a provider or seek immediate assistance at the ER.    Was pt given zone sheet? Yes [x]   No []     Intervention(s)    Pt educated on the importance of weighing daily first thing in the morning before dressing, before eating or drinking, and after voiding using the same scale in the same location and write results down in note pad or log. Notify us for weight gains of 3 lbs in one day or 5 lbs in one week. Notify your provider for increased SOA.  Notify your provider for swelling or increased swelling in BLE or abdominal fullness/bloating. Advised to check B/P at least once daily. Check 1-2 hours after am meds. Log results. Check B/P other times if feeling lightheaded, dizzy, or if you feel your heart rate is elevated. Document the time you checked and any symptoms you may be feeling at the time. Call 911 for sudden, severe chest/pain pressure/SOA develops. Be sure to keep your follow up appointment and bring your weight logs, B/P logs, and medication list with you to your appointment. Call us at [phone] if you have any questions.     Plan of Care    Continued education needed for heart failure symptom management and when to contact our office.

## 2021-09-11 ENCOUNTER — Observation Stay: Admit: 2021-09-11 | Discharge: 2021-09-11 | Payer: BC Managed Care – PPO

## 2021-09-11 ENCOUNTER — Ambulatory Visit: Admit: 2021-09-11 | Discharge: 2021-09-11 | Payer: BC Managed Care – PPO

## 2021-09-11 ENCOUNTER — Encounter: Admit: 2021-09-11 | Discharge: 2021-09-11 | Payer: BC Managed Care – PPO

## 2021-09-11 DIAGNOSIS — E119 Type 2 diabetes mellitus without complications: Secondary | ICD-10-CM

## 2021-09-11 DIAGNOSIS — I253 Aneurysm of heart: Secondary | ICD-10-CM

## 2021-09-11 DIAGNOSIS — I10 Essential (primary) hypertension: Secondary | ICD-10-CM

## 2021-09-11 DIAGNOSIS — M810 Age-related osteoporosis without current pathological fracture: Secondary | ICD-10-CM

## 2021-09-11 DIAGNOSIS — C801 Malignant (primary) neoplasm, unspecified: Secondary | ICD-10-CM

## 2021-09-11 DIAGNOSIS — I428 Other cardiomyopathies: Secondary | ICD-10-CM

## 2021-09-11 DIAGNOSIS — R51 Headache: Secondary | ICD-10-CM

## 2021-09-11 DIAGNOSIS — I358 Other nonrheumatic aortic valve disorders: Secondary | ICD-10-CM

## 2021-09-11 DIAGNOSIS — M549 Dorsalgia, unspecified: Secondary | ICD-10-CM

## 2021-09-11 DIAGNOSIS — M797 Fibromyalgia: Secondary | ICD-10-CM

## 2021-09-11 DIAGNOSIS — I361 Nonrheumatic tricuspid (valve) insufficiency: Secondary | ICD-10-CM

## 2021-09-11 DIAGNOSIS — I5022 Chronic systolic (congestive) heart failure: Secondary | ICD-10-CM

## 2021-09-11 DIAGNOSIS — C50919 Malignant neoplasm of unspecified site of unspecified female breast: Secondary | ICD-10-CM

## 2021-09-11 DIAGNOSIS — R55 Syncope and collapse: Secondary | ICD-10-CM

## 2021-09-11 DIAGNOSIS — M1990 Unspecified osteoarthritis, unspecified site: Secondary | ICD-10-CM

## 2021-09-11 DIAGNOSIS — C539 Malignant neoplasm of cervix uteri, unspecified: Secondary | ICD-10-CM

## 2021-09-11 DIAGNOSIS — J431 Panlobular emphysema: Secondary | ICD-10-CM

## 2021-09-11 DIAGNOSIS — I5042 Chronic combined systolic (congestive) and diastolic (congestive) heart failure: Secondary | ICD-10-CM

## 2021-09-11 DIAGNOSIS — G459 Transient cerebral ischemic attack, unspecified: Secondary | ICD-10-CM

## 2021-09-11 DIAGNOSIS — K8020 Calculus of gallbladder without cholecystitis without obstruction: Secondary | ICD-10-CM

## 2021-09-11 DIAGNOSIS — B999 Unspecified infectious disease: Secondary | ICD-10-CM

## 2021-09-11 DIAGNOSIS — H547 Unspecified visual loss: Secondary | ICD-10-CM

## 2021-09-11 DIAGNOSIS — R0600 Dyspnea, unspecified: Secondary | ICD-10-CM

## 2021-09-11 DIAGNOSIS — I4891 Unspecified atrial fibrillation: Secondary | ICD-10-CM

## 2021-09-11 DIAGNOSIS — I340 Nonrheumatic mitral (valve) insufficiency: Secondary | ICD-10-CM

## 2021-09-11 DIAGNOSIS — E039 Hypothyroidism, unspecified: Secondary | ICD-10-CM

## 2021-09-11 DIAGNOSIS — E785 Hyperlipidemia, unspecified: Secondary | ICD-10-CM

## 2021-09-11 DIAGNOSIS — I00 Rheumatic fever without heart involvement: Secondary | ICD-10-CM

## 2021-09-11 LAB — CBC AND DIFF
ABSOLUTE BASO COUNT: 0 K/UL (ref 0–0.20)
ABSOLUTE EOS COUNT: 0.1 K/UL (ref 0–0.45)
ABSOLUTE LYMPH COUNT: 1.3 K/UL — ABNORMAL LOW (ref >60)
ABSOLUTE MONO COUNT: 0.5 K/UL (ref 0–0.80)
ABSOLUTE NEUTROPHIL: 3.4 K/UL (ref 1.8–7.0)
BASOPHILS %: 1 % (ref 0–2)
EOSINOPHILS %: 2 % — ABNORMAL HIGH (ref 0–5)
HEMATOCRIT: 35 % — ABNORMAL LOW (ref 36–45)
MCH: 29 pg — ABNORMAL HIGH (ref <12)
MDW (MONOCYTE DISTRIBUTION WIDTH): 18 (ref <20.7)
MONOCYTES %: 11 % (ref 4–12)
PLATELET COUNT: 195 K/UL — ABNORMAL HIGH (ref <20.1)
RBC COUNT: 4.1 M/UL (ref 4.0–5.0)
WBC COUNT: 5.6 K/UL (ref 4.5–11.0)

## 2021-09-11 LAB — BNP POC ER: BNP POC: 882 pg/mL — ABNORMAL HIGH (ref 0–100)

## 2021-09-11 LAB — BASIC METABOLIC PANEL
ANION GAP: 9 % (ref 3–12)
BLD UREA NITROGEN: 19 mg/dL (ref 7–25)
CALCIUM: 9.4 mg/dL (ref 8.5–10.6)
CHLORIDE: 92 MMOL/L — ABNORMAL LOW (ref 98–110)
CO2: 36 MMOL/L — ABNORMAL HIGH (ref 21–30)
CREATININE: 1.1 mg/dL — ABNORMAL HIGH (ref 0.4–1.00)
EGFR: 50 mL/min — ABNORMAL LOW (ref >60)
POTASSIUM: 2.9 MMOL/L — ABNORMAL LOW (ref 3.5–5.1)
SODIUM: 137 MMOL/L (ref 137–147)

## 2021-09-11 LAB — COMPREHENSIVE METABOLIC PANEL
CALCIUM: 9.9 mg/dL — ABNORMAL HIGH (ref 8.5–10.6)
CREATININE: 1.2 mg/dL — ABNORMAL HIGH (ref 0.4–1.00)
POTASSIUM: 3.2 MMOL/L — ABNORMAL LOW (ref 3.5–5.1)
TOTAL BILIRUBIN: 0.5 mg/dL (ref 0.3–1.2)

## 2021-09-11 LAB — MAGNESIUM: MAGNESIUM: 1.8 mg/dL (ref 1.6–2.6)

## 2021-09-11 LAB — PTT (APTT): PTT: 31 s — ABNORMAL HIGH (ref 24.0–36.5)

## 2021-09-11 MED ORDER — BUMETANIDE 1 MG PO TAB
1 mg | Freq: Two times a day (BID) | ORAL | 0 refills
Start: 2021-09-11 — End: ?
  Administered 2021-09-13 (×2): 1 mg via ORAL

## 2021-09-11 MED ORDER — CARVEDILOL 6.25 MG PO TAB
6.25 mg | Freq: Two times a day (BID) | ORAL | 0 refills
Start: 2021-09-11 — End: ?
  Administered 2021-09-12: 13:00:00 6.25 mg via ORAL

## 2021-09-11 MED ORDER — POLYETHYLENE GLYCOL 3350 17 GRAM PO PWPK
1 | Freq: Every day | ORAL | 0 refills | PRN
Start: 2021-09-11 — End: ?

## 2021-09-11 MED ORDER — ONDANSETRON HCL (PF) 4 MG/2 ML IJ SOLN
4 mg | INTRAVENOUS | 0 refills | PRN
Start: 2021-09-11 — End: ?

## 2021-09-11 MED ORDER — MELATONIN 5 MG PO TAB
5 mg | Freq: Every evening | ORAL | 0 refills | PRN
Start: 2021-09-11 — End: ?

## 2021-09-11 MED ORDER — ASPIRIN 81 MG PO CHEW
81 mg | Freq: Every day | ORAL | 0 refills
Start: 2021-09-11 — End: ?
  Administered 2021-09-12 – 2021-09-13 (×2): 81 mg via ORAL

## 2021-09-11 MED ORDER — SENNOSIDES-DOCUSATE SODIUM 8.6-50 MG PO TAB
1 | Freq: Every day | ORAL | 0 refills | PRN
Start: 2021-09-11 — End: ?

## 2021-09-11 MED ORDER — POTASSIUM CHLORIDE 20 MEQ PO TBTQ
40 meq | Freq: Once | ORAL | 0 refills | Status: CP
Start: 2021-09-11 — End: ?
  Administered 2021-09-12: 04:00:00 40 meq via ORAL

## 2021-09-11 MED ORDER — POTASSIUM CHLORIDE IN WATER 10 MEQ/50 ML IV PGBK
10 meq | INTRAVENOUS | 0 refills | Status: AC
Start: 2021-09-11 — End: ?
  Administered 2021-09-12: 05:00:00 10 meq via INTRAVENOUS

## 2021-09-11 MED ORDER — ALBUTEROL SULFATE 90 MCG/ACTUATION IN HFAA
2 | RESPIRATORY_TRACT | 0 refills | PRN
Start: 2021-09-11 — End: ?

## 2021-09-11 MED ORDER — LOSARTAN 50 MG PO TAB
100 mg | Freq: Every day | ORAL | 0 refills
Start: 2021-09-11 — End: ?
  Administered 2021-09-12 – 2021-09-13 (×2): 100 mg via ORAL

## 2021-09-11 MED ORDER — ONDANSETRON 4 MG PO TBDI
4 mg | ORAL | 0 refills | PRN
Start: 2021-09-11 — End: ?

## 2021-09-11 MED ORDER — ACETAMINOPHEN 325 MG PO TAB
650 mg | Freq: Once | ORAL | 0 refills | Status: CP
Start: 2021-09-11 — End: ?
  Administered 2021-09-12: 03:00:00 650 mg via ORAL

## 2021-09-11 MED ORDER — POTASSIUM CHLORIDE IN WATER 10 MEQ/50 ML IV PGBK
10 meq | INTRAVENOUS | 0 refills | Status: AC
Start: 2021-09-11 — End: ?
  Administered 2021-09-12: 07:00:00 10 meq via INTRAVENOUS

## 2021-09-11 MED ORDER — HYDRALAZINE 25 MG PO TAB
25 mg | Freq: Once | ORAL | 0 refills | Status: CP
Start: 2021-09-11 — End: ?
  Administered 2021-09-12: 03:00:00 25 mg via ORAL

## 2021-09-11 MED ORDER — SPIRONOLACTONE 25 MG PO TAB
25 mg | Freq: Every day | ORAL | 0 refills
Start: 2021-09-11 — End: ?
  Administered 2021-09-12: 15:00:00 25 mg via ORAL

## 2021-09-11 MED ORDER — LEVOTHYROXINE 150 MCG PO TAB
150 ug | Freq: Every day | ORAL | 0 refills
Start: 2021-09-11 — End: ?
  Administered 2021-09-12 – 2021-09-13 (×2): 150 ug via ORAL

## 2021-09-11 MED ORDER — HYDRALAZINE 25 MG PO TAB
25 mg | Freq: Three times a day (TID) | ORAL | 0 refills
Start: 2021-09-11 — End: ?
  Administered 2021-09-12 – 2021-09-13 (×4): 25 mg via ORAL

## 2021-09-11 MED ORDER — CARVEDILOL 6.25 MG PO TAB
6.25 mg | Freq: Once | ORAL | 0 refills | Status: CP
Start: 2021-09-11 — End: ?
  Administered 2021-09-12: 03:00:00 6.25 mg via ORAL

## 2021-09-11 MED ORDER — APIXABAN 5 MG PO TAB
5 mg | Freq: Two times a day (BID) | ORAL | 0 refills
Start: 2021-09-11 — End: ?
  Administered 2021-09-12 – 2021-09-13 (×3): 5 mg via ORAL

## 2021-09-11 MED ORDER — ATORVASTATIN 10 MG PO TAB
20 mg | Freq: Every day | ORAL | 0 refills
Start: 2021-09-11 — End: ?
  Administered 2021-09-12 – 2021-09-13 (×2): 20 mg via ORAL

## 2021-09-11 NOTE — Telephone Encounter
1640- Sent [facility] lab results (today's BMP results) and sent message to Marval Regal, APRN to inform her of Critical Potassium = 2.9 and eGFR = 50. Will await response.

## 2021-09-11 NOTE — Telephone Encounter
1655- Received return call from Marval Regal, APRN and informed her of Critical Potassium = 2.9, eGFR = 50  and other BMP results from today. Order received to send patient to [facility] ER Immediately and to call report to [facility] ER.

## 2021-09-11 NOTE — Telephone Encounter
[place] report to [place], ER RN at [facility] ER and informed of Potassium = 2.9 and EF = 45% and that Marval Regal, APRN wants patient taken straight to an ER bed for treatment of Hypokalemia. Report acknowledged.

## 2021-09-11 NOTE — Progress Notes
1655- Received return call from Marval Regal, APRN and informed her of Critical Potassium = 2.9, eGFR = 50  and other BMP results from today. Order received to send patient to [facility] ER Immediately and to call report to [facility] ER.

## 2021-09-12 MED ADMIN — CARVEDILOL 12.5 MG PO TAB [77424]: 12.5 mg | ORAL | @ 23:00:00 | NDC 00904630261

## 2021-09-12 MED ADMIN — METFORMIN 500 MG PO TAB [10544]: 500 mg | ORAL | @ 23:00:00 | NDC 00904716261

## 2021-09-12 MED ADMIN — METFORMIN 500 MG PO TAB [10544]: 500 mg | ORAL | @ 16:00:00 | NDC 00904716261

## 2021-09-12 MED ADMIN — INSULIN ASPART 100 UNIT/ML SC FLEXPEN [87504]: 8 [IU] | SUBCUTANEOUS | @ 16:00:00 | NDC 00169633910

## 2021-09-12 MED ADMIN — SODIUM CHLORIDE 0.9 % IV SOLP [27838]: 250 mL | INTRAVENOUS | @ 06:00:00 | Stop: 2021-09-12 | NDC 00338004902

## 2021-09-13 ENCOUNTER — Encounter: Admit: 2021-09-13 | Discharge: 2021-09-13 | Payer: BC Managed Care – PPO

## 2021-09-13 MED ADMIN — POTASSIUM CHLORIDE 20 MEQ PO TBTQ [35943]: 40 meq | ORAL | @ 14:00:00 | Stop: 2021-09-13 | NDC 00832532511

## 2021-09-13 MED ADMIN — METFORMIN 500 MG PO TAB [10544]: 500 mg | ORAL | @ 14:00:00 | Stop: 2021-09-13 | NDC 00904716261

## 2021-09-13 MED ADMIN — SPIRONOLACTONE 50 MG PO TAB [11426]: 50 mg | ORAL | @ 14:00:00 | Stop: 2021-09-13 | NDC 60687047611

## 2021-09-13 MED ADMIN — CARVEDILOL 12.5 MG PO TAB [77424]: 12.5 mg | ORAL | @ 14:00:00 | Stop: 2021-09-13 | NDC 00904630261

## 2021-09-13 MED ADMIN — MAGNESIUM OXIDE 400 MG (241.3 MG MAGNESIUM) PO TAB [10491]: 400 mg | ORAL | @ 14:00:00 | Stop: 2021-09-13 | NDC 64980033912

## 2021-09-14 ENCOUNTER — Encounter: Admit: 2021-09-14 | Discharge: 2021-09-14 | Payer: BC Managed Care – PPO

## 2021-09-14 NOTE — Telephone Encounter
Patient Discharge Date from hospital: 09/13/21  Date Call Attempted: 09/14/21  Number of Attempts: 1  Date Call Completed:  5/523    Two Patient Identifier complete: Yes [x]     Next Appointment    Next follow-up appointment on 09/20/21 at 1100 with Ivory Broad, APRN at our [place] clinic.    Transportation    Does pt have transportation?  Yes [x]     No []    NA []      Home Health    [facility]) [phone] Fax) [phone]    Medications    Does pt have all medications? Yes  [x]     No []     Reviewed administration and dosing for Bumex Hydralazine, and Carvedilol.    START taking:  dapagliflozin (FARXIGA)  glimepiride (AmaryL)  CHANGE how you take:  bumetanide (BUMEX)  carvediloL (COREG)  spironolactone (ALDACTONE)  STOP taking:  ibuprofen 200 mg tablet (ADVIL)    Diet    - 200 mg cholesterol, 2 G Na, 2 L fluid restriction   - 1600 and 2000 calories per day. This is equal to 60g (grams) of carbohydrates per meal, and 30g of carbohydrates for a bedtime snack    Is patient following prescribed diet and restrictions?  Yes [x]    No []      Scale/Weight    Does pt have a scale at home?  Yes [x]    No []     Did pt weight first thing this morning?  Yes [x]    No []      If yes, what was pt's first morning weight today?  137.0    Signs and Symptoms    Pt reports the following symptoms:     No BLE edema or upper abdominal bloating/tightness. States SOA is better.     Pt verbalized understanding of signs and symptoms of HF and when to contact a provider or seek immediate assistance at the ER.    Was pt given zone sheet? Yes []   No [x]     Intervention(s)    Pt educated on the importance of weighing daily first thing in the morning before dressing, before eating or drinking, and after voiding using the same scale in the same location and write results down in note pad or log. Notify us for weight gains of 3 lbs in one day or 5 lbs in one week. Notify your provider for increased SOA.  Notify your provider for swelling or increased swelling in BLE or abdominal fullness/bloating. Advised to check B/P at least once daily. Check 1-2 hours after am meds. Log results. Check B/P other times if feeling lightheaded, dizzy, or if you feel your heart rate is elevated. Document the time you checked and any symptoms you may be feeling at the time. Call 911 for sudden, severe chest/pain pressure/SOA develops. Be sure to keep your follow up appointment and bring your weight logs, B/P logs, and medication list with you to your appointment. Call us at [phone] if you have any questions.     Plan of Care    Continued education needed for heart failure symptom management and when to contact our office.

## 2021-09-19 ENCOUNTER — Encounter: Admit: 2021-09-19 | Discharge: 2021-09-19 | Payer: BC Managed Care – PPO

## 2021-09-19 NOTE — Progress Notes
Date of Service: 09/20/2021      HPI            Kimberly Mitchell was seen today in the Heart Failure Clinic today. Kimberly Mitchell is a 81 y.o. 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    Kimberly Mitchell was last seen by Inge Rise, APRN  on 09/11/21.  At that time, her  weight was noted to be 142 pounds, and no medication changes were made.  She was referred to the valve clinic for evaluation of her aortic stenosis and mitral regurgitation.  She had lab work completed which did reveal a potassium of 2.9 and she was referred to the ED for evaluation of hypokalemia, hypertension and dehydration.  She was briefly admitted, and she received IV fluids and was monitored.  Her home bumex dose was decreased to 1mg  po BID (from 2mg  BID).    Today Kimberly Mitchell reports she has been doing okay.  She reports that she called the emergency department yesterday to discuss her diuretics.  She believes she was dehydrated as she felt tired and drained.  She was instructed to hold her diuretics yesterday and have lab work done.  Unfortunately, her home health nurse could not get her blood work done.  She reports her weights have been around 137 pounds at home.  She does have some increased shortness of breath while climbing stairs.  Otherwise, she denies any lower extremity edema or abdominal bloating, chest pain or palpitations, she does occasionally have some dizziness, but denies PND or orthopnea.  She reports her appetite has been fair.         Vitals:    09/20/21 1037   BP: (!) 149/76   BP Source: Arm, Right Upper   Pulse: 71   Temp: 36.1 ?C (97 ?F)   SpO2: 96%   O2 Device: None (Room air) TempSrc: Skin   PainSc: Zero   Weight: 63 kg (139 lb)   Height: 160 cm (5' 3)     Body mass index is 24.62 kg/m?Marland Kitchen    Past Medical History  Patient Active Problem List    Diagnosis Date Noted   ? Acute on chronic HFrEF (heart failure with reduced ejection fraction) (HCC) 08/30/2021   ? Frequent falls 03/02/2021   ? Upper respiratory infection 03/02/2021   ? Atrial fibrillation (HCC) 10/13/2020   ? Postoperative anemia due to acute blood loss 09/03/2020   ? AKI (acute kidney injury) (HCC) 09/03/2020   ? Nonrheumatic mitral valve stenosis 09/03/2020   ? Chronic anticoagulation 09/03/2020     Eliquis     ? Fall at home 09/03/2020     R hip pain d/t fall preop     ? S/P mitral valve clip implantation 09/01/2020     09/01/20 - [facility]     ? Hypokalemia 09/01/2020   ? Acute on chronic combined systolic (congestive) and diastolic (congestive) heart failure (HCC) 08/30/2020   ? Moderate mitral regurgitation 08/30/2020   ? Mild CAD 08/30/2020     08/18/20: Cath by Dr. Mackey Birchwood - Mitraclip work-up  1. Left main coronary artery:  The left main coronary artery arises normally from the left coronary sinus.  The left main coronary artery  has a very short course and it immediately bifurcates into left anterior descending artery and left circumflex artery.  The left anterior descending artery is free of angiographically significant disease.  2. Left anterior descending artery:  The left anterior descending artery is a large caliber vessel, which gives rise to 2 diagonal vessels.  The mid left anterior descending artery has a focal 40% stenosis.  This is a type 1 LAD.  3. Left circumflex artery:  The left circumflex artery is a large caliber dominant vessel, which gives rise to a medium caliber obtuse marginal branch.  Then, the left circumflex artery distally gives rise to left posterolateral branches and left posterior descending artery.  The left circumflex artery as well as its branches are free of angiographically significant disease.  4. Right coronary artery:  The right coronary artery is a small caliber and nondominant vessel, which arises from the right coronary cusp.  The right coronary artery is free of angiographically significant disease.     ? 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 Chronic combined systolic (congestive) and diastolic (congestive) heart failure (HCC) 07/10/2020   ? Tricuspid regurgitation 07/10/2020   ? Dyspnea      a. 07/2019 hospitalization for worsening  SOA @ [facility], [place]      ? Pulmonary hypertension (HCC)      a. 07/20/2019 - Echo - EF 55% / PAP 64 mmHg / moderate aortic stenosis, moderate aortic insufficiency/ mitral annular calcificaiton and moderate mitral regurgitation @ [place]     ? 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 Use of letrozole (Femara) 07/14/2017   ? 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 History of breast cancer 02/10/2014   ? Carotid bruit 11/26/2012   ? 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 Aortic insufficiency 09/25/2008     Moderate     ? Fibromyalgia 09/25/2008     Follows with a rheumatologist in [place] (based out of [facility])     ? Atrial septal aneurysm 09/25/2008   ? Hypothyroidism 09/25/2008   ? Essential hypertension 07/27/2006     02/06 renal ultrasound: Normal segmental arterial wave forms in both kidneys. No signs of renal  artery stenosis.     ? Type 2 diabetes mellitus (HCC) 07/27/2006   ? Hyperlipidemia 07/27/2006           Physical Exam  General Appearance: In NAD  Neck Veins: normal JVP, neck veins are not distended; no HJR   Chest Inspection: chest is normal in appearance   Respiratory Effort: breathing comfortably, no respiratory distress   Auscultation/Percussion: lungs clear to auscultation, no rales, rhonchi or wheezing   Cardiac Rhythm: regular rhythm and normal rate   Cardiac Auscultation: S1, S2 normal, no rub, no definite S3  or S4   Murmurs: 3/6 systolic murmur noted  Lower Extremity Edema: no lower extremity edema   Abdominal Exam: soft, non-tender, no obvious masses, bowel sounds normal   Gait & Station: walks with cane  Orientation: oriented to person, place and time   Affect & Mood: appropriate and sustained affect   Language and Memory: patient responsive and seems to comprehend information   Neurologic Exam: neurological assessment grossly intact   Vital signs were reviewed      Cardiovascular Studies  Echo 08/31/21:  1. Normal left ventricle size based on internal diameters.  Moderate concentric hypertrophy.  Mildly reduced ejection fraction.  Visually, left ventricular ejection fraction is 45% with diffuse hypokinesis.  There is a degree of diastolic dysfunction but unable to quantify severity on the study.  2. Mildly dilated right ventricle with mildly reduced function based on qualitative assessment.  3. Severely dilated left atrium.  Moderately dilated right atrium.  4. The interatrial septum is aneurysmal and persistently bows from left to right. ?Likely increased left atrial pressure. ?No evidence of interatrial shunt by color Doppler  5. Normal central venous pressure.  6. Moderate-to-severe tricuspid valve regurgitation from a dilated annulus.  7. A single NTW MitraClip is noted in the A2/P2 position. ?There is severe mitral valve regurgitation. ?Transmitral mean gradient is noted to be 7 mm Hg at a heart rate of 65-74 bpm.  8. Calcific aortic valve with reduced systolic excursion. Body surface area is 2.43 m?.  Left ventricular stroke-volume index is low at 22 mL/m? per beat which leads to underestimation of the gradients across the aortic valve.  Mean systolic gradient=21 mmHg, DVI=0.29, peak velocity=3.1 m/s, and aortic valve area by direct planimetry=0.94 cm?Marland Kitchen  Overall, probably moderate-to-severe stenosis.  Moderate regurgitation.  9. Mild pulmonic valve regurgitation.  10. Estimated peak systolic pulmonary artery pressure is 49 mm Hg (note is made that blood pressure is 166/97 mmHg at the time of the examination).  11. Mildly dilated aortic root.  The proximal ascending thoracic aorta size is normal.  12. No pericardial effusion.  ?  A direct visual comparison was performed with the most recent transthoracic echocardiogram from October 13, 2020.  Left ventricle function has slightly improved over the interval time period.  The severity of aortic valve stenosis and aortic valve regurgitation are fairly similar.  The degrees of both tricuspid and mitral valve regurgitation have slightly worsened over the interval time period.     R/LHC 08/18/20:  RA 16  RV 61/16  PA 58/24 with mean 35  PCWP 22  TPG 13  PVR 6.34  CO/CI 2.05/1.18 by thermo  CO/CI 1.8/1.1 by Fick  Moderate nonobstructive coronary artery disease involving the left anterior descending artery.    Problems Addressed Today  Encounter Diagnoses   Name Primary?   ? Chronic combined systolic (congestive) and diastolic (congestive) heart failure (HCC) Yes   ? Nonrheumatic tricuspid valve regurgitation    ? Essential hypertension    ? AKI (acute kidney injury) (HCC)           Assessment/Plan:    HFrEF   NICM   - Estimated dry weight: 137 pounds  - hypovolemic upon exam  - Diuretic regimen:   Home diuretics: Current Changes    bumex 1mg  po BID  changed to Bumex 1 mg daily with additional 1 mg as needed for weight gain       - NYHA class *III, stage C symptoms  - GDMT:     GDMT Current Dose Changes   BB carvedilol 6.25 mg twice daily    ACEI/ARB/ARNI  losartan 100 mg daily    Aldosterone Antagonist spironolactone 25 mg daily    SGLT-2 Inhibitor      Hydralazine/Nitrate  hydralazine 25 mg 3 times daily    Ivabradine       - Last echo: 08/31/21 as noted above  - Device: no device with EF of 45%  - Labs: Point-of-care BMP done today  - Follow-up: 10/22/2021 with Inge Rise, APRN  Nonobstructive CAD  - last ischemic evaluation: [facility] 08/2020  - denying anginal type symptoms   -  continue asa and statin.  Hypertension:  - continue medications as noted above  Severe MR s/p MitraClip 08/2020  Tricuspid regurgitaiton  Aortic stenosis   - TR noted to be moderate to severe  - AS noted to be moderate to severe and moderate aortic insufficiency  - referred to the valve clinic during last clinic visit  AKI  - recent creatinine trends 0.9-1.0  -Point-of-care creatinine today: 1.4  -Asked her to hold additional Bumex today and reduce scheduled Bumex as noted above  -Also encouraged her to reduce her fluid intake today  -Repeat BMP next week  Paroxysmal Atrial Fibrillation  - oral anticoagulation: Eliquis 5mg  po BID  - denies bleeding issues.   Iron Deficiency anemia  - last iron panel completed 09/03/21: iron 36, 11%, TIBC 341, ferritin 20  - received IV venofer 4/25-4/27/23  - will need repeat iron panel in several months' time  Diabetes Mellitus Type II  - HgA1C   Lab Results   Component Value Date/Time    HGBA1C 10.3 (H) 08/31/2021 04:15 AM    HGBA1C 7.4 (H) 11/22/2020 12:47 PM    HGBA1C 8.8 (H) 08/30/2020 10:41 AM      Dyslipidemia  Lab Results   Component Value Date    CHOL 140 08/31/2021    TRIG 87 08/31/2021    HDL 56 08/31/2021    LDL 71 08/31/2021    VLDL 17 08/31/2021    NONHDLCHOL 84 08/31/2021    CHOLHDLC 3 09/15/2018      - continue Atorvastatin 20 mg po daily             Plan:  The following is a copy of the written instructions and plan I gave to the patient during her office visit.    Patient Instructions   Thank you for coming to [facility].  Your instructions today:     1.  Medications: change your bumex to one tablet daily.  If you have a 3 pound weight gain, take an additional tablet in the afternoon.  Plan for 2 tablets of potassium every day with additional 2 tablets when you take the afternoon dose of bumex.  2.  Labs: check your kidneys and electrolytes today  3.   Next follow up appointment: 10/22/21 with [name]  4.  Call for any worsening symptoms of shortness of breath, swelling, sudden weight gain, lightheadedness, heart racing or chest pain.  5.  Remember to weigh your self daily and call for weight increase greater than 3 pounds overnight or 5 pounds in one week.  6.  Taking your medications as directed helps keep you out of the hospital:  please call if you have concerns about the side effects, cost or refills.  7.  Follow low sodium dietary restriction- 2000 mg daily.      Ivory Broad, APRN  [phone]    CP: Drs. Tandy Gaw        Future Appointments   Date Time Provider Department Center   10/22/2021  1:30 PM Elvia Collum, APRN-NP [facility] CVM Exam   10/23/2021  3:00 PM Eugenie Birks, MD [id number] IM   12/06/2021  2:30 PM Vanetta Shawl I, MD [facility] CVM Exam             Total Time Today was 50 minutes in the following activities: Preparing to see the patient, Obtaining and/or reviewing separately obtained history, Performing a medically appropriate examination and/or evaluation, Counseling  and educating the patient/family/caregiver, Ordering medications, tests, or procedures and Documenting clinical information in the electronic or other health record.      Ivory Broad, APRN, CHFN  Advance Heart Failure APP  [facility]  Collaborating physicians: Drs. Zubair Shah/[employer]

## 2021-09-20 ENCOUNTER — Encounter: Admit: 2021-09-20 | Discharge: 2021-09-20 | Payer: BC Managed Care – PPO

## 2021-09-20 ENCOUNTER — Ambulatory Visit: Admit: 2021-09-20 | Discharge: 2021-09-20 | Payer: BC Managed Care – PPO

## 2021-09-20 DIAGNOSIS — I253 Aneurysm of heart: Secondary | ICD-10-CM

## 2021-09-20 DIAGNOSIS — E039 Hypothyroidism, unspecified: Secondary | ICD-10-CM

## 2021-09-20 DIAGNOSIS — J431 Panlobular emphysema: Secondary | ICD-10-CM

## 2021-09-20 DIAGNOSIS — I5022 Chronic systolic (congestive) heart failure: Secondary | ICD-10-CM

## 2021-09-20 DIAGNOSIS — I5042 Chronic combined systolic (congestive) and diastolic (congestive) heart failure: Secondary | ICD-10-CM

## 2021-09-20 DIAGNOSIS — I340 Nonrheumatic mitral (valve) insufficiency: Secondary | ICD-10-CM

## 2021-09-20 DIAGNOSIS — C539 Malignant neoplasm of cervix uteri, unspecified: Secondary | ICD-10-CM

## 2021-09-20 DIAGNOSIS — I10 Essential (primary) hypertension: Secondary | ICD-10-CM

## 2021-09-20 DIAGNOSIS — C50919 Malignant neoplasm of unspecified site of unspecified female breast: Secondary | ICD-10-CM

## 2021-09-20 DIAGNOSIS — G459 Transient cerebral ischemic attack, unspecified: Secondary | ICD-10-CM

## 2021-09-20 DIAGNOSIS — I428 Other cardiomyopathies: Secondary | ICD-10-CM

## 2021-09-20 DIAGNOSIS — M549 Dorsalgia, unspecified: Secondary | ICD-10-CM

## 2021-09-20 DIAGNOSIS — M810 Age-related osteoporosis without current pathological fracture: Secondary | ICD-10-CM

## 2021-09-20 DIAGNOSIS — M1990 Unspecified osteoarthritis, unspecified site: Secondary | ICD-10-CM

## 2021-09-20 DIAGNOSIS — R51 Headache: Secondary | ICD-10-CM

## 2021-09-20 DIAGNOSIS — I361 Nonrheumatic tricuspid (valve) insufficiency: Secondary | ICD-10-CM

## 2021-09-20 DIAGNOSIS — I4891 Unspecified atrial fibrillation: Secondary | ICD-10-CM

## 2021-09-20 DIAGNOSIS — E785 Hyperlipidemia, unspecified: Secondary | ICD-10-CM

## 2021-09-20 DIAGNOSIS — K8020 Calculus of gallbladder without cholecystitis without obstruction: Secondary | ICD-10-CM

## 2021-09-20 DIAGNOSIS — N179 Acute kidney failure, unspecified: Secondary | ICD-10-CM

## 2021-09-20 DIAGNOSIS — I358 Other nonrheumatic aortic valve disorders: Secondary | ICD-10-CM

## 2021-09-20 DIAGNOSIS — C801 Malignant (primary) neoplasm, unspecified: Secondary | ICD-10-CM

## 2021-09-20 DIAGNOSIS — E119 Type 2 diabetes mellitus without complications: Secondary | ICD-10-CM

## 2021-09-20 DIAGNOSIS — H547 Unspecified visual loss: Secondary | ICD-10-CM

## 2021-09-20 DIAGNOSIS — R0600 Dyspnea, unspecified: Secondary | ICD-10-CM

## 2021-09-20 DIAGNOSIS — I00 Rheumatic fever without heart involvement: Secondary | ICD-10-CM

## 2021-09-20 DIAGNOSIS — B999 Unspecified infectious disease: Secondary | ICD-10-CM

## 2021-09-20 DIAGNOSIS — M797 Fibromyalgia: Secondary | ICD-10-CM

## 2021-09-20 MED ORDER — BUMETANIDE 1 MG PO TAB
ORAL_TABLET | 3 refills | Status: AC
Start: 2021-09-20 — End: ?

## 2021-09-20 MED ORDER — POTASSIUM CHLORIDE 20 MEQ PO TBTQ
ORAL_TABLET | 3 refills | 30.00000 days | Status: AC
Start: 2021-09-20 — End: ?

## 2021-10-10 ENCOUNTER — Encounter: Admit: 2021-10-10 | Discharge: 2021-10-10 | Payer: BC Managed Care – PPO

## 2021-10-10 NOTE — Progress Notes
Attempted to call patient in order to complete a [facility] form on 10/10/21. Patient answered and stated to call her back in 52mns. When calling her again there was no answer. I called her husband and he stated that he will have her call me back and I have yet to receive a call. Will attempt to call again in the near future. JS

## 2021-10-11 ENCOUNTER — Encounter: Admit: 2021-10-11 | Discharge: 2021-10-11 | Payer: BC Managed Care – PPO

## 2021-10-11 NOTE — Telephone Encounter
Unable to reach patient via telephone, left pre procedure instructions on voice mail/answering machine.  The following details were given:    This is a reminder call that are scheduled to have a TEE on 10/16/21. Please check in at 8:30am at the main cardiology desk inside the lobby of [facility].  Following the TEE, you will have an appointment with Dr. Cresenciano Lick and Dr. Azzie Glatter at 1:30pm.    Please do not eat or drink after midnight except for sips of water ONLY with morning medications.  No gum, mints, candy, smoking or chewing tobacco the day of.  Morning medications should include all heart medications such as: blood pressure medications, antiarrhythmic and blood thinners if you normally take those in the morning. Please hold these medications the morning of your test-   Metformin  Spironolactone  Farxiga  Bumen  Vitamins  Supplements     You must have a driver present or available to drive you home.     If you have any questions please feel free to contact your Cardiovascular procedure nurses at [phone].

## 2021-10-15 ENCOUNTER — Encounter: Admit: 2021-10-15 | Discharge: 2021-10-15 | Payer: BC Managed Care – PPO

## 2021-10-15 NOTE — Progress Notes
RN spoke with [employer] on phone.

## 2021-10-15 NOTE — Telephone Encounter
Nurse navigator noticed patient's TEE for tomorrow had been cancelled through [facility] but the office visits with the valve clinic physicians are still scheduled. Connected with patient over the phone to check in. Patient states she needs to cancel all appointments for tomorrow. She states she fell approximately one week ago and is still pretty sore. Let her know all appointments for tomorrow will be cancelled. She understands nurse navigator, Eustace Pen, will reach out early next week to check in on her status and reschedule appointments if she's ready. Patient verbalizes an understanding of this plan and has no questions/concerns at this time.

## 2021-10-18 ENCOUNTER — Encounter: Admit: 2021-10-18 | Discharge: 2021-10-18 | Payer: BC Managed Care – PPO

## 2021-10-18 DIAGNOSIS — E1165 Type 2 diabetes mellitus with hyperglycemia: Secondary | ICD-10-CM

## 2021-10-22 ENCOUNTER — Ambulatory Visit: Admit: 2021-10-22 | Discharge: 2021-10-23 | Payer: BC Managed Care – PPO

## 2021-10-22 ENCOUNTER — Encounter: Admit: 2021-10-22 | Discharge: 2021-10-22 | Payer: BC Managed Care – PPO

## 2021-10-22 ENCOUNTER — Ambulatory Visit: Admit: 2021-10-22 | Discharge: 2021-10-22 | Payer: BC Managed Care – PPO

## 2021-10-22 DIAGNOSIS — J431 Panlobular emphysema: Secondary | ICD-10-CM

## 2021-10-22 DIAGNOSIS — M810 Age-related osteoporosis without current pathological fracture: Secondary | ICD-10-CM

## 2021-10-22 DIAGNOSIS — M797 Fibromyalgia: Secondary | ICD-10-CM

## 2021-10-22 DIAGNOSIS — I10 Essential (primary) hypertension: Secondary | ICD-10-CM

## 2021-10-22 DIAGNOSIS — I480 Paroxysmal atrial fibrillation: Secondary | ICD-10-CM

## 2021-10-22 DIAGNOSIS — I2510 Atherosclerotic heart disease of native coronary artery without angina pectoris: Secondary | ICD-10-CM

## 2021-10-22 DIAGNOSIS — G459 Transient cerebral ischemic attack, unspecified: Secondary | ICD-10-CM

## 2021-10-22 DIAGNOSIS — I342 Nonrheumatic mitral (valve) stenosis: Secondary | ICD-10-CM

## 2021-10-22 DIAGNOSIS — I5042 Chronic combined systolic (congestive) and diastolic (congestive) heart failure: Secondary | ICD-10-CM

## 2021-10-22 DIAGNOSIS — E119 Type 2 diabetes mellitus without complications: Secondary | ICD-10-CM

## 2021-10-22 DIAGNOSIS — E782 Mixed hyperlipidemia: Secondary | ICD-10-CM

## 2021-10-22 DIAGNOSIS — C539 Malignant neoplasm of cervix uteri, unspecified: Secondary | ICD-10-CM

## 2021-10-22 DIAGNOSIS — I351 Nonrheumatic aortic (valve) insufficiency: Secondary | ICD-10-CM

## 2021-10-22 DIAGNOSIS — I358 Other nonrheumatic aortic valve disorders: Secondary | ICD-10-CM

## 2021-10-22 DIAGNOSIS — I5043 Acute on chronic combined systolic (congestive) and diastolic (congestive) heart failure: Secondary | ICD-10-CM

## 2021-10-22 DIAGNOSIS — I253 Aneurysm of heart: Secondary | ICD-10-CM

## 2021-10-22 DIAGNOSIS — I428 Other cardiomyopathies: Secondary | ICD-10-CM

## 2021-10-22 DIAGNOSIS — R0600 Dyspnea, unspecified: Secondary | ICD-10-CM

## 2021-10-22 DIAGNOSIS — Z9889 Other specified postprocedural states: Secondary | ICD-10-CM

## 2021-10-22 DIAGNOSIS — I340 Nonrheumatic mitral (valve) insufficiency: Secondary | ICD-10-CM

## 2021-10-22 DIAGNOSIS — C50919 Malignant neoplasm of unspecified site of unspecified female breast: Secondary | ICD-10-CM

## 2021-10-22 DIAGNOSIS — R51 Headache: Secondary | ICD-10-CM

## 2021-10-22 DIAGNOSIS — C801 Malignant (primary) neoplasm, unspecified: Secondary | ICD-10-CM

## 2021-10-22 DIAGNOSIS — I5022 Chronic systolic (congestive) heart failure: Secondary | ICD-10-CM

## 2021-10-22 DIAGNOSIS — M1990 Unspecified osteoarthritis, unspecified site: Secondary | ICD-10-CM

## 2021-10-22 DIAGNOSIS — E039 Hypothyroidism, unspecified: Secondary | ICD-10-CM

## 2021-10-22 DIAGNOSIS — B999 Unspecified infectious disease: Secondary | ICD-10-CM

## 2021-10-22 DIAGNOSIS — I00 Rheumatic fever without heart involvement: Secondary | ICD-10-CM

## 2021-10-22 DIAGNOSIS — I272 Pulmonary hypertension, unspecified: Secondary | ICD-10-CM

## 2021-10-22 DIAGNOSIS — M549 Dorsalgia, unspecified: Secondary | ICD-10-CM

## 2021-10-22 DIAGNOSIS — H547 Unspecified visual loss: Secondary | ICD-10-CM

## 2021-10-22 DIAGNOSIS — E785 Hyperlipidemia, unspecified: Secondary | ICD-10-CM

## 2021-10-22 DIAGNOSIS — I4891 Unspecified atrial fibrillation: Secondary | ICD-10-CM

## 2021-10-22 DIAGNOSIS — K8020 Calculus of gallbladder without cholecystitis without obstruction: Secondary | ICD-10-CM

## 2021-10-22 DIAGNOSIS — I361 Nonrheumatic tricuspid (valve) insufficiency: Secondary | ICD-10-CM

## 2021-10-22 LAB — CBC
HEMATOCRIT: 37 % (ref 36–45)
HEMOGLOBIN: 12 g/dL (ref 12.0–15.0)
MCH: 28 pg (ref 26–34)
MCHC: 32 g/dL (ref 32.0–36.0)
MCV: 87 FL (ref 80–100)
MPV: 8.4 FL (ref 7–11)
PLATELET COUNT: 226 K/UL (ref 150–400)
RBC COUNT: 4.3 M/UL (ref >60)
RDW: 15 % — ABNORMAL HIGH (ref 11–15)
WBC COUNT: 5 K/UL (ref 4.5–11.0)

## 2021-10-22 LAB — BASIC METABOLIC PANEL
BLD UREA NITROGEN: 13 mg/dL (ref 7–25)
CHLORIDE: 103 MMOL/L (ref 98–110)
CO2: 30 MMOL/L (ref 21–30)
CREATININE: 0.9 mg/dL (ref 0.4–1.00)
GLUCOSE,PANEL: 108 mg/dL — ABNORMAL HIGH (ref 70–100)
POTASSIUM: 3.9 MMOL/L (ref 3.5–5.1)
SODIUM: 140 MMOL/L (ref 137–147)

## 2021-10-22 LAB — BNP (B-TYPE NATRIURETIC PEPTI): BNP: 152 pg/mL — ABNORMAL HIGH (ref 0–100)

## 2021-10-23 ENCOUNTER — Encounter: Admit: 2021-10-23 | Discharge: 2021-10-23 | Payer: BC Managed Care – PPO

## 2021-10-23 ENCOUNTER — Ambulatory Visit: Admit: 2021-10-23 | Discharge: 2021-10-24 | Payer: BC Managed Care – PPO

## 2021-10-23 DIAGNOSIS — C539 Malignant neoplasm of cervix uteri, unspecified: Secondary | ICD-10-CM

## 2021-10-23 DIAGNOSIS — I5042 Chronic combined systolic (congestive) and diastolic (congestive) heart failure: Secondary | ICD-10-CM

## 2021-10-23 DIAGNOSIS — R51 Headache: Secondary | ICD-10-CM

## 2021-10-23 DIAGNOSIS — M1990 Unspecified osteoarthritis, unspecified site: Secondary | ICD-10-CM

## 2021-10-23 DIAGNOSIS — M797 Fibromyalgia: Secondary | ICD-10-CM

## 2021-10-23 DIAGNOSIS — K8020 Calculus of gallbladder without cholecystitis without obstruction: Secondary | ICD-10-CM

## 2021-10-23 DIAGNOSIS — E119 Type 2 diabetes mellitus without complications: Secondary | ICD-10-CM

## 2021-10-23 DIAGNOSIS — I272 Pulmonary hypertension, unspecified: Secondary | ICD-10-CM

## 2021-10-23 DIAGNOSIS — Z9889 Other specified postprocedural states: Secondary | ICD-10-CM

## 2021-10-23 DIAGNOSIS — Z95818 Presence of other cardiac implants and grafts: Secondary | ICD-10-CM

## 2021-10-23 DIAGNOSIS — E039 Hypothyroidism, unspecified: Secondary | ICD-10-CM

## 2021-10-23 DIAGNOSIS — I4891 Unspecified atrial fibrillation: Secondary | ICD-10-CM

## 2021-10-23 DIAGNOSIS — I428 Other cardiomyopathies: Secondary | ICD-10-CM

## 2021-10-23 DIAGNOSIS — I253 Aneurysm of heart: Secondary | ICD-10-CM

## 2021-10-23 DIAGNOSIS — G459 Transient cerebral ischemic attack, unspecified: Secondary | ICD-10-CM

## 2021-10-23 DIAGNOSIS — I00 Rheumatic fever without heart involvement: Secondary | ICD-10-CM

## 2021-10-23 DIAGNOSIS — I5022 Chronic systolic (congestive) heart failure: Secondary | ICD-10-CM

## 2021-10-23 DIAGNOSIS — I358 Other nonrheumatic aortic valve disorders: Secondary | ICD-10-CM

## 2021-10-23 DIAGNOSIS — M810 Age-related osteoporosis without current pathological fracture: Secondary | ICD-10-CM

## 2021-10-23 DIAGNOSIS — I10 Essential (primary) hypertension: Secondary | ICD-10-CM

## 2021-10-23 DIAGNOSIS — I361 Nonrheumatic tricuspid (valve) insufficiency: Secondary | ICD-10-CM

## 2021-10-23 DIAGNOSIS — C801 Malignant (primary) neoplasm, unspecified: Secondary | ICD-10-CM

## 2021-10-23 DIAGNOSIS — H547 Unspecified visual loss: Secondary | ICD-10-CM

## 2021-10-23 DIAGNOSIS — R0600 Dyspnea, unspecified: Secondary | ICD-10-CM

## 2021-10-23 DIAGNOSIS — I340 Nonrheumatic mitral (valve) insufficiency: Secondary | ICD-10-CM

## 2021-10-23 DIAGNOSIS — C50919 Malignant neoplasm of unspecified site of unspecified female breast: Secondary | ICD-10-CM

## 2021-10-23 DIAGNOSIS — E785 Hyperlipidemia, unspecified: Secondary | ICD-10-CM

## 2021-10-23 DIAGNOSIS — J431 Panlobular emphysema: Secondary | ICD-10-CM

## 2021-10-23 DIAGNOSIS — B999 Unspecified infectious disease: Secondary | ICD-10-CM

## 2021-10-23 DIAGNOSIS — I2510 Atherosclerotic heart disease of native coronary artery without angina pectoris: Secondary | ICD-10-CM

## 2021-10-23 DIAGNOSIS — M549 Dorsalgia, unspecified: Secondary | ICD-10-CM

## 2021-10-23 MED ORDER — METFORMIN 500 MG PO TAB
1000 mg | ORAL_TABLET | Freq: Two times a day (BID) | ORAL | 3 refills | Status: AC
Start: 2021-10-23 — End: ?

## 2021-10-23 MED ORDER — BLOOD GLUCOSE TEST MISC STRP
1 | Freq: Every day | 3 refills | 50.00000 days | Status: AC
Start: 2021-10-23 — End: ?

## 2021-10-23 MED ORDER — ONETOUCH VERIO METER MISC MISC
1 | Freq: Before meals | 0 refills | Status: AC
Start: 2021-10-23 — End: ?

## 2021-10-24 ENCOUNTER — Encounter: Admit: 2021-10-24 | Discharge: 2021-10-24 | Payer: BC Managed Care – PPO

## 2021-10-24 DIAGNOSIS — K8020 Calculus of gallbladder without cholecystitis without obstruction: Secondary | ICD-10-CM

## 2021-10-24 DIAGNOSIS — R51 Headache: Secondary | ICD-10-CM

## 2021-10-24 DIAGNOSIS — M549 Dorsalgia, unspecified: Secondary | ICD-10-CM

## 2021-10-24 DIAGNOSIS — M810 Age-related osteoporosis without current pathological fracture: Secondary | ICD-10-CM

## 2021-10-24 DIAGNOSIS — R0600 Dyspnea, unspecified: Secondary | ICD-10-CM

## 2021-10-24 DIAGNOSIS — I00 Rheumatic fever without heart involvement: Secondary | ICD-10-CM

## 2021-10-24 DIAGNOSIS — E785 Hyperlipidemia, unspecified: Secondary | ICD-10-CM

## 2021-10-24 DIAGNOSIS — I4891 Unspecified atrial fibrillation: Secondary | ICD-10-CM

## 2021-10-24 DIAGNOSIS — G459 Transient cerebral ischemic attack, unspecified: Secondary | ICD-10-CM

## 2021-10-24 DIAGNOSIS — M797 Fibromyalgia: Secondary | ICD-10-CM

## 2021-10-24 DIAGNOSIS — E039 Hypothyroidism, unspecified: Secondary | ICD-10-CM

## 2021-10-24 DIAGNOSIS — H547 Unspecified visual loss: Secondary | ICD-10-CM

## 2021-10-24 DIAGNOSIS — I428 Other cardiomyopathies: Secondary | ICD-10-CM

## 2021-10-24 DIAGNOSIS — I5042 Chronic combined systolic (congestive) and diastolic (congestive) heart failure: Secondary | ICD-10-CM

## 2021-10-24 DIAGNOSIS — C801 Malignant (primary) neoplasm, unspecified: Secondary | ICD-10-CM

## 2021-10-24 DIAGNOSIS — C50919 Malignant neoplasm of unspecified site of unspecified female breast: Secondary | ICD-10-CM

## 2021-10-24 DIAGNOSIS — E119 Type 2 diabetes mellitus without complications: Secondary | ICD-10-CM

## 2021-10-24 DIAGNOSIS — I253 Aneurysm of heart: Secondary | ICD-10-CM

## 2021-10-24 DIAGNOSIS — M1990 Unspecified osteoarthritis, unspecified site: Secondary | ICD-10-CM

## 2021-10-24 DIAGNOSIS — C539 Malignant neoplasm of cervix uteri, unspecified: Secondary | ICD-10-CM

## 2021-10-24 DIAGNOSIS — I5022 Chronic systolic (congestive) heart failure: Secondary | ICD-10-CM

## 2021-10-24 DIAGNOSIS — E1165 Type 2 diabetes mellitus with hyperglycemia: Secondary | ICD-10-CM

## 2021-10-24 DIAGNOSIS — I10 Essential (primary) hypertension: Secondary | ICD-10-CM

## 2021-10-24 DIAGNOSIS — I358 Other nonrheumatic aortic valve disorders: Secondary | ICD-10-CM

## 2021-10-24 DIAGNOSIS — I340 Nonrheumatic mitral (valve) insufficiency: Secondary | ICD-10-CM

## 2021-10-24 DIAGNOSIS — B999 Unspecified infectious disease: Secondary | ICD-10-CM

## 2021-10-24 DIAGNOSIS — J431 Panlobular emphysema: Secondary | ICD-10-CM

## 2021-10-26 ENCOUNTER — Encounter: Admit: 2021-10-26 | Discharge: 2021-10-26 | Payer: BC Managed Care – PPO

## 2021-10-26 NOTE — Telephone Encounter
Contacted patient regarding reschedule valve evaluation. She wishes to speak on Monday regarding getting the valve evaluation rescheduled.

## 2021-10-29 ENCOUNTER — Encounter: Admit: 2021-10-29 | Discharge: 2021-10-29 | Payer: BC Managed Care – PPO

## 2021-10-29 DIAGNOSIS — I342 Nonrheumatic mitral (valve) stenosis: Secondary | ICD-10-CM

## 2021-10-29 DIAGNOSIS — I351 Nonrheumatic aortic (valve) insufficiency: Secondary | ICD-10-CM

## 2021-10-29 DIAGNOSIS — I361 Nonrheumatic tricuspid (valve) insufficiency: Secondary | ICD-10-CM

## 2021-10-29 DIAGNOSIS — Z9889 Other specified postprocedural states: Secondary | ICD-10-CM

## 2021-10-29 DIAGNOSIS — I340 Nonrheumatic mitral (valve) insufficiency: Secondary | ICD-10-CM

## 2021-11-01 NOTE — Progress Notes
Patient not seen. Actively having intermittent chest pain and extremely elevated BP. Patient taken to ER.     Kimberly Haff Alver Sorrow, APRN-NP

## 2021-11-02 ENCOUNTER — Encounter: Admit: 2021-11-02 | Discharge: 2021-11-02 | Payer: BC Managed Care – PPO

## 2021-11-02 ENCOUNTER — Emergency Department: Admit: 2021-11-02 | Discharge: 2021-11-02 | Payer: BC Managed Care – PPO

## 2021-11-02 ENCOUNTER — Ambulatory Visit: Admit: 2021-11-02 | Discharge: 2021-11-03 | Payer: BC Managed Care – PPO

## 2021-11-02 ENCOUNTER — Inpatient Hospital Stay: Admit: 2021-11-02 | Payer: BC Managed Care – PPO

## 2021-11-02 VITALS — BP 168/74 | HR 76

## 2021-11-02 VITALS — BP 210/101 | HR 76

## 2021-11-02 VITALS — BP 192/117 | HR 81

## 2021-11-02 VITALS — BP 205/118 | HR 82

## 2021-11-02 VITALS — HR 80

## 2021-11-02 VITALS — BP 162/68 | HR 76

## 2021-11-02 VITALS — BP 172/78 | HR 78

## 2021-11-02 VITALS — BP 156/90 | HR 78

## 2021-11-02 VITALS — BP 163/81 | HR 84

## 2021-11-02 VITALS — BP 167/87 | HR 79 | Temp 97.40000°F

## 2021-11-02 VITALS — BP 202/85 | HR 79

## 2021-11-02 VITALS — BP 163/81 | HR 79

## 2021-11-02 VITALS — BP 176/115 | HR 78

## 2021-11-02 VITALS — Ht 65.0 in | Wt 143.0 lb

## 2021-11-02 VITALS — BP 160/148 | HR 81

## 2021-11-02 VITALS — BP 147/80 | HR 73

## 2021-11-02 VITALS — BP 208/101 | HR 93 | Temp 99.10000°F | Ht 64.0 in | Wt 150.0 lb

## 2021-11-02 VITALS — BP 164/78 | HR 76

## 2021-11-02 VITALS — BP 157/99 | HR 81

## 2021-11-02 DIAGNOSIS — I10 Essential (primary) hypertension: Secondary | ICD-10-CM

## 2021-11-02 DIAGNOSIS — I5042 Chronic combined systolic (congestive) and diastolic (congestive) heart failure: Secondary | ICD-10-CM

## 2021-11-02 DIAGNOSIS — M1990 Unspecified osteoarthritis, unspecified site: Secondary | ICD-10-CM

## 2021-11-02 DIAGNOSIS — E785 Hyperlipidemia, unspecified: Secondary | ICD-10-CM

## 2021-11-02 DIAGNOSIS — I00 Rheumatic fever without heart involvement: Secondary | ICD-10-CM

## 2021-11-02 DIAGNOSIS — R0600 Dyspnea, unspecified: Secondary | ICD-10-CM

## 2021-11-02 DIAGNOSIS — C539 Malignant neoplasm of cervix uteri, unspecified: Secondary | ICD-10-CM

## 2021-11-02 DIAGNOSIS — G459 Transient cerebral ischemic attack, unspecified: Secondary | ICD-10-CM

## 2021-11-02 DIAGNOSIS — M810 Age-related osteoporosis without current pathological fracture: Secondary | ICD-10-CM

## 2021-11-02 DIAGNOSIS — C50919 Malignant neoplasm of unspecified site of unspecified female breast: Secondary | ICD-10-CM

## 2021-11-02 DIAGNOSIS — I428 Other cardiomyopathies: Secondary | ICD-10-CM

## 2021-11-02 DIAGNOSIS — C801 Malignant (primary) neoplasm, unspecified: Secondary | ICD-10-CM

## 2021-11-02 DIAGNOSIS — I253 Aneurysm of heart: Secondary | ICD-10-CM

## 2021-11-02 DIAGNOSIS — R51 Headache: Secondary | ICD-10-CM

## 2021-11-02 DIAGNOSIS — I340 Nonrheumatic mitral (valve) insufficiency: Secondary | ICD-10-CM

## 2021-11-02 DIAGNOSIS — M549 Dorsalgia, unspecified: Secondary | ICD-10-CM

## 2021-11-02 DIAGNOSIS — M797 Fibromyalgia: Secondary | ICD-10-CM

## 2021-11-02 DIAGNOSIS — I4891 Unspecified atrial fibrillation: Secondary | ICD-10-CM

## 2021-11-02 DIAGNOSIS — B999 Unspecified infectious disease: Secondary | ICD-10-CM

## 2021-11-02 DIAGNOSIS — J431 Panlobular emphysema: Secondary | ICD-10-CM

## 2021-11-02 DIAGNOSIS — K8020 Calculus of gallbladder without cholecystitis without obstruction: Secondary | ICD-10-CM

## 2021-11-02 DIAGNOSIS — I358 Other nonrheumatic aortic valve disorders: Secondary | ICD-10-CM

## 2021-11-02 DIAGNOSIS — I5022 Chronic systolic (congestive) heart failure: Secondary | ICD-10-CM

## 2021-11-02 DIAGNOSIS — E119 Type 2 diabetes mellitus without complications: Secondary | ICD-10-CM

## 2021-11-02 DIAGNOSIS — H547 Unspecified visual loss: Secondary | ICD-10-CM

## 2021-11-02 DIAGNOSIS — R079 Chest pain, unspecified: Secondary | ICD-10-CM

## 2021-11-02 DIAGNOSIS — I5023 Acute on chronic systolic (congestive) heart failure: Secondary | ICD-10-CM

## 2021-11-02 DIAGNOSIS — E039 Hypothyroidism, unspecified: Secondary | ICD-10-CM

## 2021-11-02 DIAGNOSIS — E8770 Fluid overload, unspecified: Secondary | ICD-10-CM

## 2021-11-02 LAB — URINALYSIS MICROSCOPIC REFLEX TO CULTURE

## 2021-11-02 LAB — COVID-19 (SARS-COV-2) PCR

## 2021-11-02 LAB — URINALYSIS DIPSTICK REFLEX TO CULTURE
LEUKOCYTES: NEGATIVE
NITRITE: NEGATIVE
URINE ASCORBIC ACID, UA: NEGATIVE
URINE BILE: NEGATIVE
URINE BLOOD: NEGATIVE
URINE KETONE: NEGATIVE

## 2021-11-02 LAB — TSH WITH FREE T4 REFLEX: TSH: 6.3 uU/mL — ABNORMAL HIGH (ref 0.35–5.00)

## 2021-11-02 LAB — COMPREHENSIVE METABOLIC PANEL
ALBUMIN: 3.7 g/dL (ref 3.5–5.0)
ALK PHOSPHATASE: 76 U/L — ABNORMAL LOW (ref 25–110)
ALT: 10 U/L (ref 7–56)
ANION GAP: 7 K/UL (ref 3–12)
AST: 17 U/L (ref 7–40)
BLD UREA NITROGEN: 13 mg/dL — ABNORMAL HIGH (ref <12)
CALCIUM: 9.2 mg/dL — ABNORMAL HIGH (ref 8.5–10.6)
CHLORIDE: 105 MMOL/L — ABNORMAL LOW (ref 98–110)
CO2: 28 MMOL/L (ref 21–30)
SODIUM: 140 MMOL/L (ref 137–147)

## 2021-11-02 LAB — CBC AND DIFF
ABSOLUTE BASO COUNT: 0 K/UL (ref 0–0.20)
ABSOLUTE EOS COUNT: 0.1 K/UL (ref 0–0.45)
ABSOLUTE LYMPH COUNT: 1.1 K/UL — ABNORMAL LOW (ref >60)
ABSOLUTE MONO COUNT: 0.3 K/UL (ref 0–0.80)
HEMOGLOBIN: 11 g/dL — ABNORMAL LOW (ref 12.0–15.0)
MDW (MONOCYTE DISTRIBUTION WIDTH): 18 (ref <20.7)
MPV: 8.3 FL — ABNORMAL HIGH (ref 7–11)
WBC COUNT: 4.8 K/UL (ref 4.5–11.0)

## 2021-11-02 LAB — HIGH SENSITIVITY TROPONIN I 2 HOUR: HIGH SENSITIVITY TROPONIN I DELTA VALUE: 1 mg/dL — ABNORMAL HIGH (ref 0.4–1.00)

## 2021-11-02 LAB — PROTIME INR (PT): PROTIME: 14 s — ABNORMAL HIGH (ref 9.5–14.2)

## 2021-11-02 LAB — POC GLUCOSE: POC GLUCOSE: 125 mg/dL — ABNORMAL HIGH (ref 70–100)

## 2021-11-02 LAB — HIGH SENSITIVITY TROPONIN I 0 HOUR: HIGH SENSITIVITY TROPONIN I 0 HOUR: 17 ng/L — ABNORMAL HIGH (ref <12)

## 2021-11-02 LAB — CREATINE KINASE-CPK: CK TOTAL: 135 U/L (ref 21–215)

## 2021-11-02 LAB — INFLUENZA A/B AND RSV PCR
FLU B: NEGATIVE FL (ref 7–11)
RSV: NEGATIVE

## 2021-11-02 MED ORDER — ATORVASTATIN 20 MG PO TAB
20 mg | Freq: Every day | ORAL | 0 refills | Status: AC
Start: 2021-11-02 — End: ?
  Administered 2021-11-03 – 2021-11-07 (×5): 20 mg via ORAL

## 2021-11-02 MED ORDER — ARTIFICIAL TEARS (PF) SINGLE DOSE DROPS GROUP
1 [drp] | Freq: Three times a day (TID) | OPHTHALMIC | 0 refills | Status: AC
Start: 2021-11-02 — End: ?
  Administered 2021-11-03 – 2021-11-07 (×8): 1 [drp] via OPHTHALMIC

## 2021-11-02 MED ORDER — ALBUTEROL SULFATE 90 MCG/ACTUATION IN HFAA
2 | RESPIRATORY_TRACT | 0 refills | Status: AC | PRN
Start: 2021-11-02 — End: ?

## 2021-11-02 MED ORDER — LEVOTHYROXINE 150 MCG PO TAB
150 ug | Freq: Every day | ORAL | 0 refills | Status: AC
Start: 2021-11-02 — End: ?
  Administered 2021-11-03 – 2021-11-07 (×5): 150 ug via ORAL

## 2021-11-02 MED ORDER — SENNOSIDES-DOCUSATE SODIUM 8.6-50 MG PO TAB
1 | Freq: Every day | ORAL | 0 refills | Status: AC | PRN
Start: 2021-11-02 — End: ?

## 2021-11-02 MED ORDER — DOCUSATE SODIUM 100 MG PO CAP
100 mg | Freq: Two times a day (BID) | ORAL | 0 refills | Status: AC
Start: 2021-11-02 — End: ?
  Administered 2021-11-03 – 2021-11-07 (×8): 100 mg via ORAL

## 2021-11-02 MED ORDER — CARVEDILOL 12.5 MG PO TAB
12.5 mg | Freq: Two times a day (BID) | ORAL | 0 refills | Status: AC
Start: 2021-11-02 — End: ?
  Administered 2021-11-03: 19:00:00 12.5 mg via ORAL

## 2021-11-02 MED ORDER — NICARDIPINE IN NACL (ISO-OS) 20 MG/200 ML (0.1 MG/ML) IV PGBK
2.5-15 mg/h | INTRAVENOUS | 0 refills | Status: AC
Start: 2021-11-02 — End: ?
  Administered 2021-11-02: 2.5 mg/h via INTRAVENOUS

## 2021-11-02 MED ORDER — APIXABAN 5 MG PO TAB
5 mg | Freq: Two times a day (BID) | ORAL | 0 refills | Status: AC
Start: 2021-11-02 — End: ?
  Administered 2021-11-03 – 2021-11-07 (×10): 5 mg via ORAL

## 2021-11-02 MED ORDER — HYDRALAZINE 50 MG PO TAB
50 mg | Freq: Three times a day (TID) | ORAL | 0 refills | Status: AC
Start: 2021-11-02 — End: ?
  Administered 2021-11-03 – 2021-11-04 (×4): 50 mg via ORAL

## 2021-11-02 MED ORDER — ASPIRIN 81 MG PO CHEW
81 mg | Freq: Every day | ORAL | 0 refills | Status: AC
Start: 2021-11-02 — End: ?
  Administered 2021-11-03 – 2021-11-07 (×5): 81 mg via ORAL

## 2021-11-02 MED ORDER — TRAMADOL 50 MG PO TAB
50 mg | Freq: Two times a day (BID) | ORAL | 0 refills | Status: AC | PRN
Start: 2021-11-02 — End: ?

## 2021-11-02 MED ORDER — LOSARTAN 50 MG PO TAB
100 mg | Freq: Every day | ORAL | 0 refills | Status: AC
Start: 2021-11-02 — End: ?
  Administered 2021-11-03: 19:00:00 100 mg via ORAL

## 2021-11-02 MED ORDER — DAPAGLIFLOZIN PROPANEDIOL 10 MG PO TAB
10 mg | Freq: Every day | ORAL | 0 refills | Status: AC
Start: 2021-11-02 — End: ?
  Administered 2021-11-03 – 2021-11-07 (×5): 10 mg via ORAL

## 2021-11-02 MED ORDER — POLYETHYLENE GLYCOL 3350 17 GRAM PO PWPK
1 | Freq: Every day | ORAL | 0 refills | Status: AC | PRN
Start: 2021-11-02 — End: ?

## 2021-11-02 MED ORDER — ONDANSETRON 4 MG PO TBDI
4 mg | ORAL | 0 refills | Status: AC | PRN
Start: 2021-11-02 — End: ?

## 2021-11-02 MED ORDER — ACETAMINOPHEN 500 MG PO TAB
500 mg | ORAL | 0 refills | Status: AC | PRN
Start: 2021-11-02 — End: ?
  Administered 2021-11-04: 14:00:00 500 mg via ORAL

## 2021-11-02 MED ORDER — BUMETANIDE 0.25 MG/ML IJ SOLN
1 mg | Freq: Once | INTRAVENOUS | 0 refills | Status: CP
Start: 2021-11-02 — End: ?
  Administered 2021-11-03: 1 mg via INTRAVENOUS

## 2021-11-02 MED ORDER — SPIRONOLACTONE 50 MG PO TAB
50 mg | Freq: Every day | ORAL | 0 refills | Status: AC
Start: 2021-11-02 — End: ?
  Administered 2021-11-03 – 2021-11-07 (×4): 50 mg via ORAL

## 2021-11-02 MED ORDER — ASPIRIN 81 MG PO CHEW
324 mg | Freq: Once | ORAL | 0 refills | Status: CP
Start: 2021-11-02 — End: ?
  Administered 2021-11-02: 23:00:00 324 mg via ORAL

## 2021-11-02 MED ORDER — LABETALOL 5 MG/ML IV SYRG
10 mg | INTRAVENOUS | 0 refills | Status: AC | PRN
Start: 2021-11-02 — End: ?

## 2021-11-02 MED ORDER — ONDANSETRON HCL (PF) 4 MG/2 ML IJ SOLN
4 mg | INTRAVENOUS | 0 refills | Status: AC | PRN
Start: 2021-11-02 — End: ?
  Administered 2021-11-03: 08:00:00 4 mg via INTRAVENOUS

## 2021-11-02 MED ORDER — MELATONIN 5 MG PO TAB
5 mg | Freq: Every evening | ORAL | 0 refills | Status: AC | PRN
Start: 2021-11-02 — End: ?
  Administered 2021-11-03: 05:00:00 5 mg via ORAL

## 2021-11-02 NOTE — ED Notes
81 y.o. F arrived to [facility] with a CC of hypertension and CP. Pt states she was at cardiologist appointment when her SBP was in the 200s and she was sent to ED. Pt reporting intermittent L sided CP described as "pulling in the shoulder" for the past month. Pt states CP has worsened today. In addition pt endorsing SOB on exertion, blurred vision, and abdominal pain. Pt denies dizziness, palpitation, HA, fevers/chills, and N/V/D. Pt resting in bed, call light within reach, bed locked and in lowest position. Patient's belongings gathered at bedside.

## 2021-11-03 ENCOUNTER — Inpatient Hospital Stay: Admit: 2021-11-03 | Discharge: 2021-11-03 | Payer: BC Managed Care – PPO

## 2021-11-03 ENCOUNTER — Encounter: Admit: 2021-11-03 | Discharge: 2021-11-03 | Payer: BC Managed Care – PPO

## 2021-11-03 VITALS — BP 136/71 | Temp 98.20000°F

## 2021-11-03 VITALS — BP 87/53

## 2021-11-03 VITALS — BP 92/54 | HR 68

## 2021-11-03 VITALS — BP 118/56 | HR 71

## 2021-11-03 VITALS — BP 167/83 | HR 95

## 2021-11-03 VITALS — BP 71/50

## 2021-11-03 VITALS — BP 172/80 | HR 84

## 2021-11-03 VITALS — BP 137/66 | HR 80

## 2021-11-03 VITALS — HR 85 | Temp 98.90000°F

## 2021-11-03 VITALS — BP 115/58 | HR 75

## 2021-11-03 VITALS — BP 117/57 | HR 72

## 2021-11-03 VITALS — BP 98/50 | HR 71

## 2021-11-03 VITALS — BP 118/57 | HR 74

## 2021-11-03 VITALS — BP 194/86 | Temp 97.70000°F

## 2021-11-03 VITALS — BP 74/43

## 2021-11-03 VITALS — BP 159/76

## 2021-11-03 VITALS — BP 115/72 | HR 83 | Temp 97.80000°F

## 2021-11-03 VITALS — BP 83/46 | HR 69

## 2021-11-03 VITALS — BP 96/45 | HR 75

## 2021-11-03 VITALS — BP 89/48 | HR 70

## 2021-11-03 VITALS — BP 118/68 | HR 79

## 2021-11-03 VITALS — BP 132/72 | HR 80

## 2021-11-03 VITALS — BP 111/62 | HR 72

## 2021-11-03 VITALS — BP 111/63 | HR 75

## 2021-11-03 VITALS — Wt 142.0 lb

## 2021-11-03 VITALS — BP 175/87 | HR 84 | Temp 98.20000°F

## 2021-11-03 VITALS — BP 82/41 | HR 70 | Temp 98.40000°F

## 2021-11-03 VITALS — BP 121/61 | HR 74

## 2021-11-03 VITALS — BP 153/75 | HR 78 | Temp 98.20000°F | Ht 65.0 in | Wt 142.0 lb

## 2021-11-03 VITALS — BP 111/54 | HR 72

## 2021-11-03 VITALS — BP 120/100 | HR 81

## 2021-11-03 VITALS — BP 130/71 | HR 100

## 2021-11-03 MED ADMIN — MAGNESIUM SULFATE IN D5W 1 GRAM/100 ML IV PGBK [166578]: 1 g | INTRAVENOUS | @ 06:00:00 | Stop: 2021-11-03 | NDC 00338170940

## 2021-11-03 MED ADMIN — SODIUM CHLORIDE 0.9 % IV SOLP [27838]: 250 mL | INTRAVENOUS | @ 21:00:00 | Stop: 2021-11-03 | NDC 00338004902

## 2021-11-03 MED ADMIN — BUMETANIDE 0.25 MG/ML IJ SOLN [9308]: 2 mg | INTRAVENOUS | @ 19:00:00 | NDC 72205010101

## 2021-11-03 MED ADMIN — REGADENOSON 0.4 MG/5 ML IV SYRG [168865]: 0.4 mg | INTRAVENOUS | @ 15:00:00 | Stop: 2021-11-03 | NDC 00469650189

## 2021-11-03 MED ADMIN — PERFLUTREN LIPID MICROSPHERES 1.1 MG/ML IV SUSP [79178]: 2 mL | INTRAVENOUS | @ 15:00:00 | Stop: 2021-11-03 | NDC 11994001116

## 2021-11-03 MED ADMIN — POTASSIUM CHLORIDE 20 MEQ PO TBTQ [35943]: 40 meq | ORAL | @ 06:00:00 | Stop: 2021-11-03 | NDC 00832532511

## 2021-11-03 MED ADMIN — RP DX TC-99M TETROFOSMIN MCI [210516]: 18.9 | INTRAVENOUS | @ 17:00:00 | Stop: 2021-11-03 | NDC 54029389609

## 2021-11-03 MED ADMIN — RP DX TC-99M TETROFOSMIN MCI [210516]: 6.6 | INTRAVENOUS | @ 15:00:00 | Stop: 2021-11-03 | NDC 54029389609

## 2021-11-03 NOTE — Progress Notes
Report received from Carolina Cellar, RN in the ED.  Awaiting pt's arrival to [place].

## 2021-11-04 VITALS — BP 109/68 | HR 80

## 2021-11-04 VITALS — BP 93/58 | HR 85

## 2021-11-04 VITALS — HR 73 | Temp 98.90000°F

## 2021-11-04 VITALS — BP 124/78 | HR 82

## 2021-11-04 VITALS — BP 84/46 | HR 72

## 2021-11-04 VITALS — BP 136/77 | HR 88

## 2021-11-04 VITALS — BP 134/71 | HR 86 | Temp 97.20000°F

## 2021-11-04 VITALS — BP 85/46 | HR 69

## 2021-11-04 VITALS — BP 123/71 | HR 100

## 2021-11-04 VITALS — BP 123/65 | HR 101

## 2021-11-04 VITALS — BP 122/73 | HR 85 | Temp 98.50000°F

## 2021-11-04 VITALS — Wt 142.9 lb

## 2021-11-04 VITALS — HR 71 | Temp 97.90000°F

## 2021-11-04 VITALS — BP 125/58 | HR 78

## 2021-11-04 VITALS — BP 129/68 | HR 88

## 2021-11-04 VITALS — BP 104/56 | HR 73

## 2021-11-04 VITALS — BP 142/73 | HR 88

## 2021-11-04 VITALS — HR 82

## 2021-11-04 VITALS — BP 144/70 | HR 98

## 2021-11-04 VITALS — BP 109/58 | HR 80

## 2021-11-04 VITALS — BP 73/47 | HR 74

## 2021-11-04 VITALS — BP 101/57 | HR 84 | Temp 99.20000°F

## 2021-11-04 VITALS — BP 130/71 | HR 98

## 2021-11-04 VITALS — BP 123/65 | HR 83

## 2021-11-04 VITALS — HR 94 | Temp 98.50000°F

## 2021-11-04 VITALS — BP 135/76

## 2021-11-04 MED ADMIN — CARVEDILOL 12.5 MG PO TAB [77424]: 12.5 mg | ORAL | @ 23:00:00 | NDC 00904630261

## 2021-11-04 MED ADMIN — CARVEDILOL 12.5 MG PO TAB [77424]: 12.5 mg | ORAL | @ 14:00:00 | NDC 00904630261

## 2021-11-04 MED ADMIN — LACTATED RINGERS IV SOLP [4318]: 250 mL | INTRAVENOUS | @ 16:00:00 | Stop: 2021-11-04 | NDC 00338011704

## 2021-11-05 ENCOUNTER — Encounter: Admit: 2021-11-05 | Discharge: 2021-11-05 | Payer: BC Managed Care – PPO

## 2021-11-05 VITALS — BP 134/67 | HR 90

## 2021-11-05 VITALS — BP 130/64 | HR 69 | Temp 98.70000°F

## 2021-11-05 VITALS — BP 155/57 | HR 72 | Temp 98.80000°F

## 2021-11-05 VITALS — BP 125/53 | HR 72 | Temp 98.40000°F

## 2021-11-05 VITALS — HR 87

## 2021-11-05 VITALS — Wt 142.2 lb

## 2021-11-05 VITALS — BP 140/68 | HR 77

## 2021-11-05 VITALS — BP 140/59 | HR 84 | Temp 99.70000°F

## 2021-11-05 VITALS — BP 153/68 | HR 81 | Temp 99.60000°F

## 2021-11-05 VITALS — BP 153/80 | HR 95 | Temp 98.80000°F

## 2021-11-05 VITALS — BP 138/76 | HR 85

## 2021-11-05 VITALS — BP 151/74 | HR 81

## 2021-11-05 MED ADMIN — CARVEDILOL 12.5 MG PO TAB [77424]: 12.5 mg | ORAL | @ 14:00:00 | NDC 00904630261

## 2021-11-05 MED ADMIN — HYDRALAZINE 25 MG PO TAB [3700]: 25 mg | ORAL | @ 21:00:00 | NDC 00904644161

## 2021-11-05 MED ADMIN — LOSARTAN 50 MG PO TAB [76938]: 50 mg | ORAL | @ 17:00:00 | NDC 68084034711

## 2021-11-05 MED ADMIN — CARVEDILOL 12.5 MG PO TAB [77424]: 12.5 mg | ORAL | @ 23:00:00 | NDC 00904630261

## 2021-11-05 MED ADMIN — INSULIN ASPART 100 UNIT/ML SC FLEXPEN [87504]: 2 [IU] | SUBCUTANEOUS | @ 14:00:00 | NDC 00169633910

## 2021-11-06 VITALS — Wt 143.4 lb

## 2021-11-06 VITALS — BP 130/62 | HR 80

## 2021-11-06 VITALS — BP 132/99 | HR 74 | Temp 97.70000°F

## 2021-11-06 VITALS — BP 134/66 | HR 73 | Temp 97.90000°F

## 2021-11-06 VITALS — HR 70

## 2021-11-06 VITALS — BP 148/68 | HR 78 | Temp 98.00000°F

## 2021-11-06 VITALS — BP 150/67 | HR 86 | Temp 99.20000°F

## 2021-11-06 VITALS — BP 139/67 | HR 71

## 2021-11-06 VITALS — BP 146/66 | HR 85 | Temp 98.00000°F

## 2021-11-06 VITALS — BP 101/51 | HR 70 | Temp 98.60000°F

## 2021-11-06 MED ADMIN — CARVEDILOL 12.5 MG PO TAB [77424]: 12.5 mg | ORAL | @ 15:00:00 | NDC 00904630261

## 2021-11-06 MED ADMIN — HYDRALAZINE 25 MG PO TAB [3700]: 25 mg | ORAL | @ 22:00:00 | NDC 00904644161

## 2021-11-06 MED ADMIN — CARVEDILOL 12.5 MG PO TAB [77424]: 12.5 mg | ORAL | @ 22:00:00 | NDC 00904630261

## 2021-11-06 MED ADMIN — HYDRALAZINE 25 MG PO TAB [3700]: 25 mg | ORAL | @ 02:00:00 | NDC 00904644161

## 2021-11-06 MED ADMIN — LOSARTAN 50 MG PO TAB [76938]: 100 mg | ORAL | @ 15:00:00 | NDC 68084034711

## 2021-11-06 MED ADMIN — HYDRALAZINE 25 MG PO TAB [3700]: 25 mg | ORAL | @ 15:00:00 | NDC 00904644161

## 2021-11-06 MED ADMIN — BUMETANIDE 2 MG PO TAB [9311]: 1 mg | ORAL | @ 16:00:00 | NDC 50268013211

## 2021-11-07 ENCOUNTER — Encounter: Admit: 2021-11-07 | Discharge: 2021-11-07 | Payer: BC Managed Care – PPO

## 2021-11-07 VITALS — BP 134/56 | HR 81 | Temp 99.30000°F

## 2021-11-07 VITALS — BP 124/78 | HR 66 | Temp 98.20000°F

## 2021-11-07 VITALS — Wt 142.0 lb

## 2021-11-07 VITALS — BP 133/60 | HR 76 | Temp 98.10000°F

## 2021-11-07 MED ADMIN — LOSARTAN 50 MG PO TAB [76938]: 100 mg | ORAL | @ 13:00:00 | Stop: 2021-11-07 | NDC 68084034711

## 2021-11-07 MED ADMIN — CARVEDILOL 12.5 MG PO TAB [77424]: 12.5 mg | ORAL | @ 13:00:00 | Stop: 2021-11-07 | NDC 00904630261

## 2021-11-07 MED ADMIN — HYDRALAZINE 25 MG PO TAB [3700]: 25 mg | ORAL | @ 02:00:00 | NDC 00904644161

## 2021-11-07 MED ADMIN — HYDRALAZINE 25 MG PO TAB [3700]: 25 mg | ORAL | @ 13:00:00 | Stop: 2021-11-07 | NDC 00904644161

## 2021-11-07 MED ADMIN — BUMETANIDE 2 MG PO TAB [9311]: 1 mg | ORAL | @ 13:00:00 | Stop: 2021-11-07 | NDC 50268013211

## 2021-11-20 ENCOUNTER — Encounter: Admit: 2021-11-20 | Discharge: 2021-11-20 | Payer: BC Managed Care – PPO

## 2021-11-20 NOTE — Telephone Encounter
Received call from [facility] [phone] reporting BP this AM was 200/104 and asks if medication changes should be made.

## 2021-11-20 NOTE — Telephone Encounter
Pt's Palliative HH RN, Oneita Kras, from [facility], called regarding pt's BP this AM. Stated that patient was 200/104 upon arrival at pt's house. RN encouraged pt to take her morning medications which included many anti-hypertensives. Pt's niece voiced that patient is very stubborn and can hardly ever be talked into talking her medications. [place] RN was successful in administering morning meds. 30 minutes after med administration, pt's BP was 217/111. Oneita Kras had to leave pt's house, but called our office looking for cardiology recs.     Pt was called. Pt stated that she was not having any CP or s/s stroke, but she did end up going to the ER after Karissa left. At the ER her BP was checked and it was down to 167/90 and she was sent home. Pt was waiting for a ride home from ER at time of phone call. Encouraged pt to take her medications as prescribed. Pt expressed understanding.     Pt's cardiology team ([facility]) was notified of hypertensive event.

## 2021-11-20 NOTE — Telephone Encounter
Gentles, Sherlyn Lick, APRN-NP  Cvm Nurse [facility] 5 minutes ago (1:52 PM)       No changes. Thank you

## 2021-11-27 ENCOUNTER — Encounter: Admit: 2021-11-27 | Discharge: 2021-11-27 | Payer: BC Managed Care – PPO

## 2021-11-27 NOTE — Progress Notes
Report Sheet    TEE           Indication: Aortic stenosis, Mitral regurg with previous MitraClip, Tricuspid regurg  Ordering Provider: Norman Clay    Name: Kimberly Mitchell     Age: 81 y.o.    DOB: Aug 30, 1940     MRN: [id number]  Patient phone number: [phone]    Communication Barriers: none noted    Lab(s) needed: No open orders; Glucose, K+ (h/o hypokalemia)  Other: APPT PRIOR FOR H&P; APPT TO FOLLOW    Device check needed: No  Device: No results found for: GENERATOR, EPDEVTYP    Pt Called:   Arrival Time: 12/04/21 at 9:45am (clinic)  Driver Information:     Date of Last H&P: 11/07/21 Fol[place]roft, MD   Additional Information: Appointment with Dorena Cookey at 10am for updated H&P; Appointment with Tadros/Zorn at 2:30pm    Prior TEE: 09/01/21     Prior DCCV:      Prior Echo:11/03/21       Previous TEE/DCCV details: ECHO:  Aortic stenosis moderate to severe; AV regurg moderate; TV regurg moderate to severe; MV regur severe (with MitraClip)    EF:   ECHO EF   Date Value Ref Range Status   11/03/2021 55 % Final       Anticoag:Eliquis     Dose:5mg      Frequency:BID     Missed:  Labs   INR    HGB 11.1 (11/07/21)   Platelets 179   Glucose 129   NA 138   K 3.6   Creatinine 0.93   Other      Medications to HOLD day of:  ? Vitamins/Supplements  ? Glimepiride  ? Bumex  ? Marcelline Deist  ? Metformin  ? Spironolactone    Probe   Gastric Surgery    GERD    Swallow difficulty    Head/Neck Surg/Rad    Chest Surg/Rad Left mastectomy/chemo 2000; Right lumpectomy/rad 2017; Mitraclip 2022   EGD    Varices/Esophag CA    GI bleed    Dental issues Full dentures     Sedation   COPD    OSA/CPAP    Asthma    Pulm HTN Yes   Anesthesia Issues    Smoker    ETOH & Frequency    Drug Use    Chronic Pain Med      Current Medications:   ? albuterol sulfate (PROAIR HFA) 90 mcg/actuation HFA aerosol inhaler Inhale two puffs by mouth into the lungs every 6 hours as needed for Wheezing.   ? aspirin 81 mg chewable tablet Chew one tablet by mouth daily. Take with food.  Indications: stroke prevention   ? atorvastatin (LIPITOR) 20 mg tablet Take one tablet by mouth daily.   ? blood sugar diagnostic (BLOOD GLUCOSE TEST) test strip Use one strip as directed daily before breakfast. Diagnosis Code: E11.65  Indications: type 2 diabetes mellitus   ? bumetanide (BUMEX) 1 mg tablet Take one tablet every morning.  Take an additional tablet in the afternoon as needed for weight gain.   ? carvediloL (COREG) 12.5 mg tablet Take one tablet by mouth twice daily with meals. Take with food.   ? dapagliflozin (FARXIGA) 10 mg tablet Take one tablet by mouth daily. Indications: heart failure with reduced ejection fraction, type 2 diabetes mellitus   ? docusate (COLACE) 100 mg capsule Take one capsule by mouth twice daily.   ? ELIQUIS 5 mg tablet Take one tablet by mouth twice  daily.   ? glimepiride (AMARYL) 2 mg tablet Take one tablet by mouth daily.   ? hydrALAZINE (APRESOLINE) 25 mg tablet Take one tablet by mouth three times daily.   ? lancets MISC Use one each as directed daily as needed. Diag Code: E11.65   ? levothyroxine (SYNTHROID) 150 mcg tablet Take one tablet by mouth daily 30 minutes before breakfast.   ? losartan (COZAAR) 100 mg tablet Take one tablet by mouth daily.   ? metFORMIN (GLUCOPHAGE) 500 mg tablet Take two tablets by mouth twice daily with meals.   ? ONETOUCH VERIO METER kit Use one strip as directed before meals and at bedtime. Diagnosis Code: E11.65   ? potassium chloride SR (K-DUR) 20 mEq tablet Take two tablets every morning.  Take an additional two tablets on the days you take an extra bumex tablet.   ? spironolactone (ALDACTONE) 50 mg tablet Take one tablet by mouth daily. Take with food.       Other Pertinent HX:  Past Medical/Surgical History:   Patient Active Problem List    Diagnosis Date Noted   ? Accelerated hypertension 11/02/2021   ? Acute on chronic HFrEF (heart failure with reduced ejection fraction) (HCC) 08/30/2021   ? Frequent falls 03/02/2021   ? Upper respiratory infection 03/02/2021   ? Atrial fibrillation (HCC) 10/13/2020   ? Postoperative anemia due to acute blood loss 09/03/2020   ? AKI (acute kidney injury) (HCC) 09/03/2020   ? Nonrheumatic mitral valve stenosis 09/03/2020   ? Chronic anticoagulation 09/03/2020   ? Fall at home 09/03/2020   ? S/P mitral valve clip implantation 09/01/2020   ? Hypokalemia 09/01/2020   ? Acute on chronic combined systolic (congestive) and diastolic (congestive) heart failure (HCC) 08/30/2020   ? Moderate mitral regurgitation 08/30/2020   ? 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 Chronic combined systolic (congestive) and diastolic (congestive) heart failure (HCC) 07/10/2020   ? Tricuspid regurgitation 07/10/2020   ? Dyspnea    ? Pulmonary hypertension (HCC)    ? 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 Use of letrozole (Femara) 07/14/2017   ? 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 History of breast cancer 02/10/2014   ? Carotid bruit 11/26/2012   ? 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 Aortic insufficiency 09/25/2008   ? Fibromyalgia 09/25/2008   ? Atrial septal aneurysm 09/25/2008   ? Hypothyroidism 09/25/2008   ? Essential hypertension 07/27/2006   ? Type 2 diabetes mellitus (HCC) 07/27/2006   ? Hyperlipidemia 07/27/2006      Past Medical History:   Diagnosis Date   ? Aortic valve sclerosis    ? Arthritis    ? Atrial fibrillation (HCC) 10/13/2020   ? Atrial septal aneurysm    ? Back pain    ? Breast cancer (HCC)    ? Cancer of cervix (HCC)    ? Chronic combined systolic (congestive) and diastolic (congestive) heart failure (HCC) 07/10/2020   ? Chronic systolic heart failure (HCC) 07/10/2020   ? Diabetes (HCC)    ? Dyspnea    ? Essential hypertension 07/27/2006    02/06 renal ultrasound: Normal segmental arterial wave forms in both kidneys. No signs of renal artery stenosis.   ? Fibromyalgia    ? Gallstones    ? Headache(784.0)    ? Hyperlipidemia    ?  Hypertension    ? Hypothyroidism    ? Infection    ? Mitral regurgitation    ? Osteoporosis    ? Other malignant neoplasm without specification of site    ? 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 Rheumatic fever     as a child    ? TIA (transient ischemic attack)    ? Type 2 diabetes mellitus (HCC) 07/27/2006   ? 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 Vision decreased       Surgical History:   Procedure Laterality Date   ? ANGIOGRAPHY CORONARY ARTERY WITH RIGHT AND LEFT HEART CATHETERIZATION N/A 08/17/2020    Performed by Marcell Barlow, [facility] at [facility] CATH LAB   ? POSSIBLE PERCUTANEOUS CORONARY STENT PLACEMENT WITH ANGIOPLASTY N/A 08/17/2020    Performed by Marcell Barlow, MD at [facility] CATH LAB   ? Percutaneous Mitral Valve Repair with MitraClip/TEE N/A 09/01/2020    Performed by Lynne Logan, MD at [facility] CATH LAB   ? COLONOSCOPY     ? HX CHOLECYSTECTOMY     ? HX HEART CATHETERIZATION     ? HX HYSTERECTOMY     ? MASTECTOMY Right        Allergies:   Allergies   Allergen Reactions   ? Diltiazem EDEMA   ? Metronidazole ITCHING and SEE COMMENTS     Low heart rate   ? Methylprednisone UNKNOWN     Sharyon Medicus, RN

## 2021-12-03 ENCOUNTER — Encounter: Admit: 2021-12-03 | Discharge: 2021-12-03 | Payer: BC Managed Care – PPO

## 2021-12-04 ENCOUNTER — Emergency Department: Admit: 2021-12-04 | Discharge: 2021-12-04 | Payer: BC Managed Care – PPO

## 2021-12-04 ENCOUNTER — Encounter: Admit: 2021-12-04 | Discharge: 2021-12-04 | Payer: BC Managed Care – PPO

## 2021-12-04 ENCOUNTER — Ambulatory Visit: Admit: 2021-12-04 | Discharge: 2021-12-04 | Payer: BC Managed Care – PPO

## 2021-12-04 DIAGNOSIS — I340 Nonrheumatic mitral (valve) insufficiency: Secondary | ICD-10-CM

## 2021-12-04 DIAGNOSIS — I160 Hypertensive urgency: Secondary | ICD-10-CM

## 2021-12-04 DIAGNOSIS — E119 Type 2 diabetes mellitus without complications: Secondary | ICD-10-CM

## 2021-12-04 DIAGNOSIS — I10 Essential (primary) hypertension: Secondary | ICD-10-CM

## 2021-12-04 DIAGNOSIS — H547 Unspecified visual loss: Secondary | ICD-10-CM

## 2021-12-04 DIAGNOSIS — Z9889 Other specified postprocedural states: Secondary | ICD-10-CM

## 2021-12-04 DIAGNOSIS — I5042 Chronic combined systolic (congestive) and diastolic (congestive) heart failure: Secondary | ICD-10-CM

## 2021-12-04 DIAGNOSIS — E785 Hyperlipidemia, unspecified: Secondary | ICD-10-CM

## 2021-12-04 DIAGNOSIS — R0600 Dyspnea, unspecified: Secondary | ICD-10-CM

## 2021-12-04 DIAGNOSIS — G459 Transient cerebral ischemic attack, unspecified: Secondary | ICD-10-CM

## 2021-12-04 DIAGNOSIS — E782 Mixed hyperlipidemia: Secondary | ICD-10-CM

## 2021-12-04 DIAGNOSIS — M810 Age-related osteoporosis without current pathological fracture: Secondary | ICD-10-CM

## 2021-12-04 DIAGNOSIS — I342 Nonrheumatic mitral (valve) stenosis: Secondary | ICD-10-CM

## 2021-12-04 DIAGNOSIS — M549 Dorsalgia, unspecified: Secondary | ICD-10-CM

## 2021-12-04 DIAGNOSIS — M797 Fibromyalgia: Secondary | ICD-10-CM

## 2021-12-04 DIAGNOSIS — 1 ERRONEOUS ENCOUNTER--DISREGARD: Secondary | ICD-10-CM

## 2021-12-04 DIAGNOSIS — I253 Aneurysm of heart: Secondary | ICD-10-CM

## 2021-12-04 DIAGNOSIS — I361 Nonrheumatic tricuspid (valve) insufficiency: Secondary | ICD-10-CM

## 2021-12-04 DIAGNOSIS — I358 Other nonrheumatic aortic valve disorders: Secondary | ICD-10-CM

## 2021-12-04 DIAGNOSIS — I00 Rheumatic fever without heart involvement: Secondary | ICD-10-CM

## 2021-12-04 DIAGNOSIS — M1990 Unspecified osteoarthritis, unspecified site: Secondary | ICD-10-CM

## 2021-12-04 DIAGNOSIS — Z136 Encounter for screening for cardiovascular disorders: Secondary | ICD-10-CM

## 2021-12-04 DIAGNOSIS — C801 Malignant (primary) neoplasm, unspecified: Secondary | ICD-10-CM

## 2021-12-04 DIAGNOSIS — R51 Headache: Secondary | ICD-10-CM

## 2021-12-04 DIAGNOSIS — I351 Nonrheumatic aortic (valve) insufficiency: Secondary | ICD-10-CM

## 2021-12-04 DIAGNOSIS — J431 Panlobular emphysema: Secondary | ICD-10-CM

## 2021-12-04 DIAGNOSIS — I2510 Atherosclerotic heart disease of native coronary artery without angina pectoris: Secondary | ICD-10-CM

## 2021-12-04 DIAGNOSIS — I5022 Chronic systolic (congestive) heart failure: Secondary | ICD-10-CM

## 2021-12-04 DIAGNOSIS — K8020 Calculus of gallbladder without cholecystitis without obstruction: Secondary | ICD-10-CM

## 2021-12-04 DIAGNOSIS — C50919 Malignant neoplasm of unspecified site of unspecified female breast: Secondary | ICD-10-CM

## 2021-12-04 DIAGNOSIS — B999 Unspecified infectious disease: Secondary | ICD-10-CM

## 2021-12-04 DIAGNOSIS — I428 Other cardiomyopathies: Secondary | ICD-10-CM

## 2021-12-04 DIAGNOSIS — I480 Paroxysmal atrial fibrillation: Secondary | ICD-10-CM

## 2021-12-04 DIAGNOSIS — C539 Malignant neoplasm of cervix uteri, unspecified: Secondary | ICD-10-CM

## 2021-12-04 DIAGNOSIS — E039 Hypothyroidism, unspecified: Secondary | ICD-10-CM

## 2021-12-04 DIAGNOSIS — I4891 Unspecified atrial fibrillation: Secondary | ICD-10-CM

## 2021-12-04 LAB — COMPREHENSIVE METABOLIC PANEL
ALBUMIN: 3.7 g/dL — ABNORMAL HIGH (ref 3.5–5.0)
ALK PHOSPHATASE: 77 U/L (ref 25–110)
ALT: 13 U/L (ref 7–56)
ANION GAP: 12 K/UL — ABNORMAL HIGH (ref 3–12)
AST: 22 U/L — ABNORMAL LOW (ref 7–40)
CALCIUM: 9.3 mg/dL — ABNORMAL HIGH (ref 8.5–10.6)
CO2: 25 MMOL/L (ref 21–30)
CREATININE: 0.9 mg/dL (ref 0.4–1.00)
EGFR: 58 mL/min — ABNORMAL LOW (ref >60)
POTASSIUM: 4 MMOL/L — ABNORMAL LOW (ref <100)
SODIUM: 140 MMOL/L (ref >40)
TOTAL BILIRUBIN: 0.4 mg/dL (ref 0.3–1.2)

## 2021-12-04 LAB — CBC AND DIFF
ABSOLUTE BASO COUNT: 0 K/UL (ref 0–0.20)
ABSOLUTE EOS COUNT: 0.2 K/UL (ref 0–0.45)
ABSOLUTE MONO COUNT: 0.4 K/UL (ref 0–0.80)
WBC COUNT: 5.5 K/UL (ref <150)

## 2021-12-04 LAB — HIGH SENSITIVITY TROPONIN I 0 HOUR: HIGH SENSITIVITY TROPONIN I 0 HOUR: 11 ng/L — ABNORMAL LOW (ref <12)

## 2021-12-04 LAB — MAGNESIUM: MAGNESIUM: 2.1 mg/dL — ABNORMAL HIGH (ref >60)

## 2021-12-04 LAB — HIGH SENSITIVITY TROPONIN I 2 HOUR
HIGH SENSITIVITY TROPONIN I 2 HOUR: 15 ng/L — ABNORMAL HIGH (ref <12)
HIGH SENSITIVITY TROPONIN I DELTA VALUE: 4 mg/dL (ref 7–25)

## 2021-12-04 MED ORDER — LOSARTAN 50 MG PO TAB
100 mg | Freq: Once | ORAL | 0 refills | Status: CP
Start: 2021-12-04 — End: ?
  Administered 2021-12-04: 17:00:00 100 mg via ORAL

## 2021-12-04 MED ORDER — ACETAMINOPHEN 500 MG PO TAB
1000 mg | Freq: Once | ORAL | 0 refills | Status: CP
Start: 2021-12-04 — End: ?
  Administered 2021-12-04: 20:00:00 1000 mg via ORAL

## 2021-12-04 MED ORDER — CARVEDILOL 12.5 MG PO TAB
12.5 mg | Freq: Once | ORAL | 0 refills | Status: CP
Start: 2021-12-04 — End: ?
  Administered 2021-12-04: 17:00:00 12.5 mg via ORAL

## 2021-12-04 MED ORDER — HYDRALAZINE 50 MG PO TAB
50 mg | Freq: Once | ORAL | 0 refills | Status: CP
Start: 2021-12-04 — End: ?
  Administered 2021-12-04: 17:00:00 50 mg via ORAL

## 2021-12-04 MED ORDER — SPIRONOLACTONE 50 MG PO TAB
50 mg | Freq: Once | ORAL | 0 refills | Status: CP
Start: 2021-12-04 — End: ?
  Administered 2021-12-04: 17:00:00 50 mg via ORAL

## 2021-12-04 NOTE — Progress Notes
Erroneous encounter-please disregard.  Patient arrived with hypertensive urgency and requesting to cancel TEE and was sent to the ER for further evaluation.

## 2021-12-04 NOTE — Unmapped
You were seen and evaluated in the emergency department today for elevated blood pressure.  You were given your home medications and your blood pressure improved.  We performed laboratory and imaging evaluation which did not reveal any significant evidence of endorgan injury.  Your heart enzyme was very mildly elevated during one of the checks which is likely related to demand in the setting of how high your blood pressure was earlier in the morning.  Your EKG overall was reassuring.  Please continue to take your medications as prescribed at home and follow-up further with your cardiologist.  If you have any worsening symptoms or other acute concerns, return to the ED at any time for further evaluation.

## 2021-12-04 NOTE — ED Notes
Home Health Nurse - Oneita Kras - [phone]

## 2021-12-04 NOTE — ED Notes
81 year old female presents to ED57 with a CC of hypertension. Pt reports going to the cardiology clinic this morning for tests, but when they took her blood pressure it was 216/100s and sent her here. Patient endorses blurry vision, headaches, and shortness of breath with exertion. Patient denies chest pain, palpitations, nausea, vomiting, and/or fever chills. Patient has history of hypertension and has not taken her blood pressure medications today.     Pt is A&O x4, calm and cooperative. Skin is warm and dry. Respirations even and unlabored, Pt resting in bed, locked in lowest position, side rails up, call light within reach. Pt placed on BP/O2/Tele monitors upon arrival to room. VSS with noted hypertension. Husband at bedside.      Belongings: Shirt, blouse, bra, pants, underwear, slippers. Patient remains in possession of all belongings.

## 2021-12-05 ENCOUNTER — Encounter: Admit: 2021-12-05 | Discharge: 2021-12-05 | Payer: BC Managed Care – PPO

## 2021-12-05 NOTE — Telephone Encounter
-----   Message from Elvia Collum, APRN-NP sent at 12/05/2021  8:39 AM CDT -----  Regarding: RE: Valve referral  Thank you for the update.   ----- Message -----  From: Dyanne Iha, APRN-NP  Sent: 12/05/2021   8:26 AM CDT  To: Elvia Collum, APRN-NP; #  Subject: Valve referral                                   [name],   Just tying in our valve navigator [name] on this message regarding Kimberly Mitchell.  I was scheduled to see Kimberly Mitchell today MRN [id number] for pre procedure H&P prior to TEE today and valve clinic appt later today but she reported to me that she cancelled the TEE last week (yet still showed up today).  She has blood pressure 200/100 and I am sending her to the ER for further evaluation.  She has palliative home health and has cancelled valve clinic appts in the past.  Not sure she is willing to proceed with any further valve intervention.  If she changes her mind we are more than happy to see her in the valve clinic but she has a long history of non compliance and cancelling appts.  Let us know if we can help in any way in the future.  If you could discuss with her further at your next follow up and determine how she would like to proceed that would be great.  Thanks!    Looks like they treated her HTN in the ED yesterday and discharged her to home.     Janelle

## 2021-12-05 NOTE — Telephone Encounter
Will close referral at this time.

## 2021-12-17 ENCOUNTER — Encounter: Admit: 2021-12-17 | Discharge: 2021-12-17 | Payer: BC Managed Care – PPO

## 2021-12-17 NOTE — Telephone Encounter
Called patient for updated readings and was provided with following numbers. She is asymptomatic during call.     BP around 1245 - 119/72, HR 74    BP around 1400 - 146/78, HR 75    We reviewed upcoming appointment and importance of attending to this appt. We discussed concerning BP readings and symptoms she would need to go back to the ER for in the meantime of being seen in clinic. She v/u and is agreeable. Will keep Marval Regal, APRN in loop prior to appt on Wednesday.

## 2021-12-17 NOTE — Telephone Encounter
Spoke with pt and family member, pt reports she went to [facility] hospital ED this morning for hypertension and no med changes were made. Pt scheduled for 8/9 as it looks like she has canceled recent appt or been sent to ED.

## 2021-12-17 NOTE — Progress Notes
URGENT RECORDS REQUEST      To: [facility]  Fax: [phone]      RE: Kimberly Mitchell  DOB: [birth year]  MRN: [id number]    Medical Records request for continuity of care.  Kimberly Mitchell is scheduled for an appointment on 12/19/21 with Inge Rise, APRN in the Advanced Heart Failure clinic at [facility].    Please fax records to [phone]   [facility] [place] [facility]  [facility] - Advanced Heart Failure/Heart Transplant/VAD Clinic  [address]   [place]  [place], [place] [address]  Ph. [phone] Fax. [phone]    Requested Records:   o Recent Labs within the last 1 mo  o Hospitalization/ED records: all Discharge Summaries and any Cardiology H&Ps            CONFIDENTIALITY NOTICE  This facsimile transmission contains confidential information, some or all of which may be protected health information as defined by the federal Health Insurance Portability & Accountability Act (HIPAA) Privacy Rule.  The information contained in this facsimile is intended only for the exclusive and confidential use of the designated recipient named above.  If you are not the designated recipient (or an employee or agent responsible for delivering this facsimile transmission to the designated recipient), you are hereby notified that any disclosure, dissemination, distribution or copying of this information is strictly prohibited and may be subject to legal restriction or sanction. If you have received this transmission in error, immediately notify the Privacy Officer at [phone] to arrange for the return or destruction of the information and all copies.    [address] - [address] [place], [place] [address] - Phone [phone] - Fax [phone] - [email or web address]

## 2021-12-17 NOTE — Telephone Encounter
Called patient back to review. She tells me she uses a battery operated BP cuff at home, and current BP is 104/59, HR 78. She does not feel lightheaded, asymptomatic during call. I explained to her this BP reading is a lot lower than her morning BP reading. She tells me that she typically does not run this low for home readings, unsure if it is accurate. We discussed concern for stroke if BP drops too suddenly. She confirms she did not receive medications while at [facility], only her morning medications. We discussed should her H/A continues and/or worsens, vision changes, CP, pre-syncopal or syncopal episodes, she will need to go back to ER as soon as possible and she is understanding to this. I advised her we recheck back in about an hour and see how things are and she v/u.

## 2021-12-18 NOTE — Progress Notes
Date of Service: 12/19/2021      HPI          Kimberly Mitchell was seen today in the Heart Failure Clinic today. Kimberly Mitchell is a 81 y.o. 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    Kimberly Mitchell has had several hospitalization in the last 3 months. Primarily for uncontrolled hypertension. Patient was seen in ER on 12/04/2021 for uncontrolled HTN. She she arrived SBP 216. However, she had her morning medications. Medications began to bring blood pressure down and she was discharged home.     Today Kimberly Mitchell reports she has been doing better. She was seen by her PCP on Monday, August 7th. Reports her blood pressure medications were increased at that time. However, she does not know exact changed that were made. She has not received new prescriptions for KexRx. She will receive new medication on Friday, August 11th.   Patient reports since she was last hospitalized she has noticed weakness and decreased mobility in her left had, difficulty swallowing and some slurred speech.   Weight is stable. She is adhering to low Na diet and fluid restriction.          Vitals:    12/19/21 0849   BP: (!) 157/74   BP Source: Arm, Right Upper   Pulse: 79   SpO2: 97%   PainSc: Zero   Weight: 68.3 kg (150 lb 9.6 oz)   Height: 165.1 cm (5' 5)     Body mass index is 25.06 kg/m?Marland Kitchen    Past Medical History  Patient Active Problem List    Diagnosis Date Noted   ? Accelerated hypertension 11/02/2021   ? Acute on chronic HFrEF (heart failure with reduced ejection fraction) (HCC) 08/30/2021   ? Frequent falls 03/02/2021   ? Upper respiratory infection 03/02/2021   ? Atrial fibrillation (HCC) 10/13/2020 ? Postoperative anemia due to acute blood loss 09/03/2020   ? AKI (acute kidney injury) (HCC) 09/03/2020   ? Nonrheumatic mitral valve stenosis 09/03/2020   ? Chronic anticoagulation 09/03/2020     Eliquis     ? Fall at home 09/03/2020     R hip pain d/t fall preop     ? S/P mitral valve clip implantation 09/01/2020     09/01/20 - [facility]     ? Hypokalemia 09/01/2020   ? Acute on chronic combined systolic (congestive) and diastolic (congestive) heart failure (HCC) 08/30/2020   ? Moderate mitral regurgitation 08/30/2020   ? Mild CAD 08/30/2020     08/18/20: Cath by Dr. Mackey Birchwood - Mitraclip work-up  1. Left main coronary artery:  The left main coronary artery arises normally from the left coronary sinus.  The left main coronary artery has a very short course and it immediately bifurcates into left anterior descending artery and left circumflex artery.  The left anterior descending artery is free of angiographically significant disease.  2. Left anterior descending artery:  The left anterior descending artery is a large caliber vessel, which gives rise to 2 diagonal vessels.  The mid left anterior descending artery has a focal 40% stenosis.  This is a type 1 LAD.  3. Left circumflex artery:  The left circumflex artery is a large caliber  dominant vessel, which gives rise to a medium caliber obtuse marginal branch.  Then, the left circumflex artery distally gives rise to left posterolateral branches and left posterior descending artery.  The left circumflex artery as well as its branches are free of angiographically significant disease.  4. Right coronary artery:  The right coronary artery is a small caliber and nondominant vessel, which arises from the right coronary cusp.  The right coronary artery is free of angiographically significant disease.     ? 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 Chronic combined systolic (congestive) and diastolic (congestive) heart failure (HCC) 07/10/2020   ? Tricuspid regurgitation 07/10/2020   ? Dyspnea      a. 07/2019 hospitalization for worsening  SOA @ [facility] [place]       ? Pulmonary hypertension (HCC)      a. 07/20/2019 - Echo - EF 55% / PAP 64 mmHg / moderate aortic stenosis, moderate aortic insufficiency/ mitral annular calcificaiton and moderate mitral regurgitation @ [place]     ? 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 Use of letrozole (Femara) 07/14/2017   ? 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 History of breast cancer 02/10/2014   ? Carotid bruit 11/26/2012   ? 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 Aortic insufficiency 09/25/2008     Moderate     ? Fibromyalgia 09/25/2008     Follows with a rheumatologist in [place] (based out of [facility])     ? Atrial septal aneurysm 09/25/2008   ? Hypothyroidism 09/25/2008   ? Essential hypertension 07/27/2006     02/06 renal ultrasound: Normal segmental arterial wave forms in both kidneys. No signs of renal  artery stenosis.     ? Type 2 diabetes mellitus (HCC) 07/27/2006   ? Hyperlipidemia 07/27/2006           Physical Exam  General Appearance: In NAD  Neck Veins: normal JVP, neck veins are not distended; no HJR   Chest Inspection: chest is normal in appearance   Respiratory Effort: breathing comfortably, no respiratory distress   Auscultation/Percussion: lungs clear to auscultation, no rales, rhonchi or wheezing   Cardiac Rhythm: regular rhythm and normal rate   Cardiac Auscultation: S1, S2 normal, no rub, no definite S3  or S4   Murmurs: 3/6 systolic murmur noted  Lower Extremity Edema: no lower extremity edema   Abdominal Exam: soft, non-tender, no obvious masses, bowel sounds normal   Gait & Station: walks with cane  Orientation: oriented to person, place and time   Affect & Mood: appropriate and sustained affect   Language and Memory: patient responsive and seems to comprehend information   Neurologic Exam: neurological assessment grossly intact   Vital signs were reviewed      Cardiovascular Studies  Echo 08/31/21:  1. Normal left ventricle size based on internal diameters.  Moderate concentric hypertrophy.  Mildly reduced ejection fraction.  Visually, left ventricular ejection fraction is 45% with diffuse hypokinesis.  There is a degree of diastolic dysfunction but unable to quantify severity on the study.  2. Mildly dilated right ventricle with mildly reduced function based on qualitative assessment.  3. Severely dilated left atrium.  Moderately dilated right atrium.  4. The interatrial septum is aneurysmal and persistently bows from left to right. ?Likely increased left atrial pressure. ?No evidence of interatrial shunt by color Doppler  5. Normal  central venous pressure.  6. Moderate-to-severe tricuspid valve regurgitation from a dilated annulus.  7. A single NTW MitraClip is noted in the A2/P2 position. ?There is severe mitral valve regurgitation. ?Transmitral mean gradient is noted to be 7 mm Hg at a heart rate of 65-74 bpm.  8. Calcific aortic valve with reduced systolic excursion. Body surface area is 2.43 m?.  Left ventricular stroke-volume index is low at 22 mL/m? per beat which leads to underestimation of the gradients across the aortic valve.  Mean systolic gradient=21 mmHg, DVI=0.29, peak velocity=3.1 m/s, and aortic valve area by direct planimetry=0.94 cm?Marland Kitchen  Overall, probably moderate-to-severe stenosis.  Moderate regurgitation.  9. Mild pulmonic valve regurgitation.  10. Estimated peak systolic pulmonary artery pressure is 49 mm Hg (note is made that blood pressure is 166/97 mmHg at the time of the examination).  11. Mildly dilated aortic root.  The proximal ascending thoracic aorta size is normal.  12. No pericardial effusion.  ?  A direct visual comparison was performed with the most recent transthoracic echocardiogram from October 13, 2020.  Left ventricle function has slightly improved over the interval time period.  The severity of aortic valve stenosis and aortic valve regurgitation are fairly similar.  The degrees of both tricuspid and mitral valve regurgitation have slightly worsened over the interval time period.     R/LHC 08/18/20:  RA 16  RV 61/16  PA 58/24 with mean 35  PCWP 22  TPG 13  PVR 6.34  CO/CI 2.05/1.18 by thermo  CO/CI 1.8/1.1 by Fick  Moderate nonobstructive coronary artery disease involving the left anterior descending artery.    Problems Addressed Today  Encounter Diagnoses   Name Primary?   ? Chronic combined systolic (congestive) and diastolic (congestive) heart failure (HCC) Yes   ? Essential hypertension    ? Mixed hyperlipidemia    ? Pulmonary hypertension (HCC)    ? Nonrheumatic tricuspid valve regurgitation    ? Mild CAD    ? S/P mitral valve clip implantation           Assessment/Plan:    HFrEF   NICM   - Estimated dry weight: 150 pounds  - mildly Euvolemic upon exam  - Diuretic regimen:   Home diuretics: Current Changes    Bumex 1 mg daily        - NYHA class III, stage C symptoms  - GDMT:     GDMT Current Dose Changes   BB Unknown.     ACEI/ARB/ARNI Unknown.     Aldosterone Antagonist Dose unknown.     SGLT-2 Inhibitor  Farxiga 10 mg daily    Hydralazine/Nitrate  dose unknown.     Ivabradine       - Last echo: 08/31/21 as noted above  - Device: no device with EF of 45%  - Labs: Check BMP today.   >  She was seen by her PCP on Monday, August 7th. Reports her blood pressure medications were increased at that time. However, she does not know exact changed that were made. She has not received new prescriptions for KexRx. She will receive new medication on Friday, August 11th.   > Will contact PCP office for updated medication list.     Nonischemic cardiomyopathy  Presumed but does have a history of breast cancer  PYP in April 2021 with no evidence of aTTR  ?  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  If no options may need to consider palliative care.  She has canceled several appointments with valve team (Dr Mackey Birchwood) due to hospitalizations or ER visits.  Recommend she call and reschedule appointment.     CAD  Minimal nonobstructive disease 40% stenosis in mid LAD as above  Continue aspirin 81mg  every day and Lipitor 20mg  hs.   ?  Paroxysmal atrial fibrillation  -On Eliquis 5 mg twice daily for [facility]  -Has been referred for watchman previously but refused indefinitely  ?  Hypertension  Patient is very hypertensive today blood pressure is 157/74.     She was seen by her PCP on Monday, August 7th. Reports her blood pressure medications were increased at that time. However, she does not know exact changed that were made. She has not received new prescriptions for KexRx. She will receive new medication on Friday, August 11th.   > will not make any changes today.     Weakness in left hand   Difficulty swallowing/speaking  >-She has had these symptoms since last hospitalization 11/07/2021  >Follow up with PCP for consideration of physical therapy, occupational therapy and speech therapy due to weakness in left hand and difficulty swallowing since last hospitalization. Can we please reach out to the PCP for therapy orders.      ?  History of DVT/PE  On AC with Eliquis  ?  Diabetes mellitus type 2  Last A1c 10.3, 08/31/2021  ?  Dyslipidemia        Lab Results   Component Value Date   ? CHOL 140 08/31/2021   ? TRIG 87 08/31/2021   ? HDL 56 08/31/2021   ? LDL 71 08/31/2021   ? VLDL 17 08/31/2021   ? NONHDLCHOL 84 08/31/2021   ? CHOLHDLC 3 09/15/2018      Atorvastatin 20 mg daily  ?  CKD  Baseline mostly 0.8-1.1    ?Hypothyroidism  Takes levothyroxine 150 mcg daily  TSH 16.94 09/01/2021    ?  Iron deficiency anemia  Hemoglobin 11.5 on 09/13/2021    09/03/2021 Iron 36,% saturation 11, TIBC 341, ferritin 20 on 09/03/2021.   Venofer 300 mg x 3 doses (4/25-4/27) during hospitalization.         Plan: Follow up in 2-3 weeks to reassess volume status and blood pressure to avoid hospitalization.         Patient Instructions   Thank you for choosing the [facility].     Your Care Plan:   ? Medications: Please get updated medication list from PCP office Francee Piccolo is working on this I believe).   ? Follow up with PCP for consideration of physical therapy, occupational therapy and speech therapy due to weakness in left hand and difficulty swallowing since last hospitalization. Can we please reach out to the PCP for therapy orders.   ? Labs: None   ? Test: None   ? Follow up appointment in 2-3 weeks.       If you are being seen in the Heart Failure clinic:   ? Follow a 2000 mg sodium diet and 2-liter fluid restriction.    ? Call for any worsening symptoms of shortness of breath, swelling, sudden weight gain, lightheadedness, heart racing, or chest pain.  ?  Weigh yourself daily and call us if your weight increase is greater than 3 pounds overnight OR 5 pounds in one week.   ? It is helpful for you to bring your list of current medications with you to all clinic visit.       If you wish to contact us:   ? Mychart is the fastest way for [place] to serve you.   ? Our triage line is available  Monday- Friday 8 AM- 4 PM. Please call and leave a message for the heart failure nurses at [phone].  ? For urgent issues after hours, on the weekends or holidays, please call [phone] to be connected with the nurse or doctor on call.  ? To schedule or change an appointment call [phone].  ? For all medication refills please contact your pharmacy or send a request through MyChart.    Team [place] Providers     Vanetta Shawl, MD  Herby Abraham, AGCNP-BC, CHFN  Inge Rise, ANP-C Lyndel Safe, MD  Ivory Broad, APRN, ANP-C, CHFN  Inge Rise, ANP-C      Nurses:   Elayne Snare, RN  Magdalen Spatz, RN  Lilyan Punt, RN  Genelle Gather, RN   Starlyn Skeans, RN       [facility]  [address] [place], [place] [address]  Phone: [phone] Fax: [phone]    At the [facility], you and your family are our top priority.  You may receive a survey via email or text message that we are asking you to complete.  We value your feedback to ensure you are satisfied with every visit and we are continually providing the highest quality of patient care.  We know your time is valuable and thank you in advance for completing the survey.      Heart Failure Zones-TUKH  Your heart failure symptom awareness and action plan.  Every Day Action Plan  ? Weigh yourself in the morning before breakfast. Write it down and compare it to yesterday's weight  ? Take your medicine, as prescribed.  ? Check for worsened swelling in your feet, ankles and stomach  ? Follow a 2000mg  salt diet.  ? Keep all healthcare appointments   GREEN ZONE:    Green Zone - Good! Symptoms are under control.  If all these statements are true, your symptoms are under control.  ? No shortness of breath  ?  No increase in ankle swelling  ? No weight gain  ? No chest pain  ? No change in your usual activity  ? Continue to follow your everyday action plan.   YELLOW ZONE:    Yellow Zone - Call your doctor.  If you notice any of the following symptoms, call your healthcare provider.  ? Increased shortness of breath with activity  ? Weight gain of 3 pounds in one day or 5 pounds in a week  ? Increased swelling in your ankles or legs  ? Increased swelling in your stomach  ? Increasing fatigue  ? You may need an adjustment of your medications.   RED ZONE:     Red Zone - Call your doctor. Make an appointment.  If you have any of the following, call [facility] at [phone] or your cardiologist.  ? Shortness of breath at rest or waking up at night feeling short of breath or coughing  ? Increased number of pillows used or needing to sit upright  to sleep  ? Chest tightness at rest  ? Dizziness, lightheadedness or feeling faint You need to schedule an appointment  EMERGENCY ZONE:     Emergency Zone - Call 911 Now  Call 9-1-1 immediately if you have any of the following:  ? Worsening chest tightness or pain that is not relieved by medication  ? Severe shortness of breath and a cough with pink, frothy sputum               Future Appointments   Date Time Provider Department Center   12/19/2021  9:00 AM Elvia Collum, APRN-NP [facility] CVM Exam   04/19/2022 10:00 AM Rod Can, PA-C [facility] IM             Total Time Today was 50 minutes in the following activities: Preparing to see the patient, Obtaining and/or reviewing separately obtained history, Performing a medically appropriate examination and/or evaluation, Counseling and educating the patient/family/caregiver, Ordering medications, tests, or procedures and Documenting clinical information in the electronic or other health record.    Elvia Collum, APRN-NP  Advance Heart Failure APP  [facility]  Collaborating physicians: Drs. Zubair Shah/[employer]

## 2021-12-19 ENCOUNTER — Encounter: Admit: 2021-12-19 | Discharge: 2021-12-19 | Payer: BC Managed Care – PPO

## 2021-12-19 ENCOUNTER — Ambulatory Visit: Admit: 2021-12-19 | Discharge: 2021-12-19 | Payer: BC Managed Care – PPO

## 2021-12-19 DIAGNOSIS — K8020 Calculus of gallbladder without cholecystitis without obstruction: Secondary | ICD-10-CM

## 2021-12-19 DIAGNOSIS — E785 Hyperlipidemia, unspecified: Secondary | ICD-10-CM

## 2021-12-19 DIAGNOSIS — I2510 Atherosclerotic heart disease of native coronary artery without angina pectoris: Secondary | ICD-10-CM

## 2021-12-19 DIAGNOSIS — C801 Malignant (primary) neoplasm, unspecified: Secondary | ICD-10-CM

## 2021-12-19 DIAGNOSIS — R0600 Dyspnea, unspecified: Secondary | ICD-10-CM

## 2021-12-19 DIAGNOSIS — I361 Nonrheumatic tricuspid (valve) insufficiency: Secondary | ICD-10-CM

## 2021-12-19 DIAGNOSIS — I4891 Unspecified atrial fibrillation: Secondary | ICD-10-CM

## 2021-12-19 DIAGNOSIS — I5042 Chronic combined systolic (congestive) and diastolic (congestive) heart failure: Secondary | ICD-10-CM

## 2021-12-19 DIAGNOSIS — J431 Panlobular emphysema: Secondary | ICD-10-CM

## 2021-12-19 DIAGNOSIS — H547 Unspecified visual loss: Secondary | ICD-10-CM

## 2021-12-19 DIAGNOSIS — E119 Type 2 diabetes mellitus without complications: Secondary | ICD-10-CM

## 2021-12-19 DIAGNOSIS — I340 Nonrheumatic mitral (valve) insufficiency: Secondary | ICD-10-CM

## 2021-12-19 DIAGNOSIS — I253 Aneurysm of heart: Secondary | ICD-10-CM

## 2021-12-19 DIAGNOSIS — M549 Dorsalgia, unspecified: Secondary | ICD-10-CM

## 2021-12-19 DIAGNOSIS — M810 Age-related osteoporosis without current pathological fracture: Secondary | ICD-10-CM

## 2021-12-19 DIAGNOSIS — I10 Essential (primary) hypertension: Secondary | ICD-10-CM

## 2021-12-19 DIAGNOSIS — I428 Other cardiomyopathies: Secondary | ICD-10-CM

## 2021-12-19 DIAGNOSIS — E039 Hypothyroidism, unspecified: Secondary | ICD-10-CM

## 2021-12-19 DIAGNOSIS — C539 Malignant neoplasm of cervix uteri, unspecified: Secondary | ICD-10-CM

## 2021-12-19 DIAGNOSIS — B999 Unspecified infectious disease: Secondary | ICD-10-CM

## 2021-12-19 DIAGNOSIS — M797 Fibromyalgia: Secondary | ICD-10-CM

## 2021-12-19 DIAGNOSIS — R51 Headache: Secondary | ICD-10-CM

## 2021-12-19 DIAGNOSIS — E782 Mixed hyperlipidemia: Secondary | ICD-10-CM

## 2021-12-19 DIAGNOSIS — I358 Other nonrheumatic aortic valve disorders: Secondary | ICD-10-CM

## 2021-12-19 DIAGNOSIS — I00 Rheumatic fever without heart involvement: Secondary | ICD-10-CM

## 2021-12-19 DIAGNOSIS — Z9889 Other specified postprocedural states: Secondary | ICD-10-CM

## 2021-12-19 DIAGNOSIS — G459 Transient cerebral ischemic attack, unspecified: Secondary | ICD-10-CM

## 2021-12-19 DIAGNOSIS — I5022 Chronic systolic (congestive) heart failure: Secondary | ICD-10-CM

## 2021-12-19 DIAGNOSIS — C50919 Malignant neoplasm of unspecified site of unspecified female breast: Secondary | ICD-10-CM

## 2021-12-19 DIAGNOSIS — M1990 Unspecified osteoarthritis, unspecified site: Secondary | ICD-10-CM

## 2021-12-19 DIAGNOSIS — I272 Pulmonary hypertension, unspecified: Secondary | ICD-10-CM

## 2021-12-19 NOTE — Progress Notes
To: Dr. Erskine Emery      Fax: [phone]        RE: Kimberly Mitchell  DOB: [birth year]  MRN: [id number]    Please fax records to [phone]   [facility] [place] [facility]  [facility] Advanced [facility] Failure/[facility] Transplant/VAD [facility]  [address]   [place]  [place], [place] [address]  Ph. [phone] Fax. [phone]    Requested Records:   Most recnet OV note and Med list        CONFIDENTIALITY NOTICE  This facsimile transmission contains confidential information, some or all of which may be protected health information as defined by the federal Health Insurance Portability & Accountability Act (HIPAA) Privacy Rule.  The information contained in this facsimile is intended only for the exclusive and confidential use of the designated recipient named above.  If you are not the designated recipient (or an employee or agent responsible for delivering this facsimile transmission to the designated recipient), you are hereby notified that any disclosure, dissemination, distribution or copying of this information is strictly prohibited and may be subject to legal restriction or sanction. If you have received this transmission in error, immediately notify the Privacy Officer at [phone] to arrange for the return or destruction of the information and all copies.    [address] [place], [place] [address] - Phone [phone] - Fax [phone] - [email or web address]

## 2022-03-14 NOTE — Progress Notes
Date of Service: 03/15/2022      HPI          Kimberly Mitchell was seen today in the [facility] Failure [facility] today. Kimberly Mitchell is a 81 y.o. 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    Kimberly Mitchell has had several hospitalization in the last year Primarily for uncontrolled hypertension. Patient was last seen in ER on 12/04/2021 for uncontrolled HTN. She she arrived SBP 216. However, she had her morning medications. Medications began to bring blood pressure down and she was discharged home.     Today Kimberly Mitchell reports she has been doing fair. Reports some LE edema today.   Weight is up slightly.. She is adhering to low Na diet and fluid restriction.   Blood pressure is elevated today. However, she has not had her medications today. 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 She is unable to tell me her medications.          Vitals:    03/15/22 1319 03/15/22 1353   BP: (!) 192/94 (!) 179/82   BP Source: Arm, Right Upper    Pulse: 93 82   SpO2: 95%    O2 Device: None (Room air)    PainSc: Zero    Weight: 71 kg (156 lb 9.6 oz)    Height: 165.1 cm (5' 5)      Body mass index is 26.06 kg/m?Marland Kitchen    Past Medical History  Patient Active Problem List    Diagnosis Date Noted   ? Accelerated hypertension 11/02/2021   ? Acute on chronic HFrEF (heart failure with reduced ejection fraction) (HCC) 08/30/2021   ? Frequent falls 03/02/2021   ? Upper respiratory infection 03/02/2021   ? Atrial fibrillation (HCC) 10/13/2020   ? Postoperative anemia due to acute blood loss 09/03/2020   ? AKI (acute kidney injury) (HCC) 09/03/2020   ? Nonrheumatic mitral valve stenosis 09/03/2020   ? Chronic anticoagulation 09/03/2020     Eliquis     ? Fall at home 09/03/2020     R hip pain d/t fall preop     ? S/P mitral valve clip implantation 09/01/2020     09/01/20 - [facility]     ? Hypokalemia 09/01/2020   ? Acute on chronic combined systolic (congestive) and diastolic (congestive) heart failure (HCC) 08/30/2020   ? Moderate mitral regurgitation 08/30/2020   ? Mild CAD 08/30/2020     08/18/20: Cath by Dr. Mackey Birchwood - Mitraclip work-up  1. Left main coronary artery:  The left main coronary artery arises normally from the left coronary sinus.  The left main coronary artery has a very short course and it immediately bifurcates into left anterior descending artery and left circumflex artery.  The left anterior descending artery is free of angiographically significant disease.  2. Left anterior descending artery:  The left anterior descending artery is a large caliber vessel, which gives rise to 2 diagonal vessels.  The mid left anterior descending artery has a focal 40% stenosis.  This is a type 1 LAD.  3. Left circumflex artery:  The left circumflex artery is a large caliber dominant vessel, which gives rise to a  medium caliber obtuse marginal branch.  Then, the left circumflex artery distally gives rise to left posterolateral branches and left posterior descending artery.  The left circumflex artery as well as its branches are free of angiographically significant disease.  4. Right coronary artery:  The right coronary artery is a small caliber and nondominant vessel, which arises from the right coronary cusp.  The right coronary artery is free of angiographically significant disease.     ? 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 Chronic combined systolic (congestive) and diastolic (congestive) heart failure (HCC) 07/10/2020   ? Tricuspid regurgitation 07/10/2020   ? Dyspnea      a. 07/2019 hospitalization for worsening  SOA @ [facility], [place]      ? Pulmonary hypertension (HCC) a. 07/20/2019 - Echo - EF 55% / PAP 64 mmHg / moderate aortic stenosis, moderate aortic insufficiency/ mitral annular calcificaiton and moderate mitral regurgitation @ [place]     ? 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 Use of letrozole (Femara) 07/14/2017   ? 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 History of breast cancer 02/10/2014   ? Carotid bruit 11/26/2012   ? 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 Aortic insufficiency 09/25/2008     Moderate     ? Fibromyalgia 09/25/2008     Follows with a rheumatologist in [place] (based out of [facility])     ? Atrial septal aneurysm 09/25/2008   ? Hypothyroidism 09/25/2008   ? Essential hypertension 07/27/2006     02/06 renal ultrasound: Normal segmental arterial wave forms in both kidneys. No signs of renal  artery stenosis.     ? Type 2 diabetes mellitus (HCC) 07/27/2006   ? Hyperlipidemia 07/27/2006           Physical Exam  General Appearance: In NAD  Neck Veins: normal JVP, neck veins are not distended; no HJR   Chest Inspection: chest is normal in appearance   Respiratory Effort: breathing comfortably, no respiratory distress   Auscultation/Percussion: lungs clear to auscultation, no rales, rhonchi or wheezing   Cardiac Rhythm: regular rhythm and normal rate   Cardiac Auscultation: S1, S2 normal, no rub, no definite S3  or S4   Murmurs: 3/6 systolic murmur noted  Lower Extremity Edema: trace lower extremity edema   Abdominal Exam: soft, non-tender, no obvious masses, bowel sounds normal   Gait & Station: walks with cane  Orientation: oriented to person, place and time   Affect & Mood: appropriate and sustained affect   Language and Memory: patient responsive and seems to comprehend information   Neurologic Exam: neurological assessment grossly intact   Vital signs were reviewed      Cardiovascular Studies  11/03/2021 echo   Interpretation Summary    ? LVEF=55%  ? Severe Concentric LVH  ? Normal LV Chamber Dimension  ? Severe Left Atrial Dilatation  ? Large Interatrial Septal Anuerysm, Buldging Into The Right Atrium With No Thrombus Or PFO. Similar To 08/31/21  ? Single Mitral Valve Clip: Mean Gradient=3mmHg, P1/2=176msec, Area=2.08cm2. MR Vena Contracta=0.60cm  ? Moderate Mitral Annular Calcification: Moderate-Severe Mitral Valve Regurgitation Similar To 08/31/21  ? Aortic Valve Scleoris With Mild Aortic Valve Stenosis And Moderate Aortic Valve Regurgitaiton., Peak Velocity=2.14m/sec, Mean Gradient=57mmHg, Velocity Ratio=0.32. P1/2=444msec. Similar To 08/31/21  ? No Pericardial Effusion  ? TAPSE=1.26cm  ? PASP=21mmHg      Echo  08/31/21:  1. Normal left ventricle size based on internal diameters.  Moderate concentric hypertrophy.  Mildly reduced ejection fraction.  Visually, left ventricular ejection fraction is 45% with diffuse hypokinesis.  There is a degree of diastolic dysfunction but unable to quantify severity on the study.  2. Mildly dilated right ventricle with mildly reduced function based on qualitative assessment.  3. Severely dilated left atrium.  Moderately dilated right atrium.  4. The interatrial septum is aneurysmal and persistently bows from left to right. ?Likely increased left atrial pressure. ?No evidence of interatrial shunt by color Doppler  5. Normal central venous pressure.  6. Moderate-to-severe tricuspid valve regurgitation from a dilated annulus.  7. A single NTW MitraClip is noted in the A2/P2 position. ?There is severe mitral valve regurgitation. ?Transmitral mean gradient is noted to be 7 mm Hg at a heart rate of 65-74 bpm.  8. Calcific aortic valve with reduced systolic excursion. Body surface area is 2.43 m?.  Left ventricular stroke-volume index is low at 22 mL/m? per beat which leads to underestimation of the gradients across the aortic valve.  Mean systolic gradient=21 mmHg, DVI=0.29, peak velocity=3.1 m/s, and aortic valve area by direct planimetry=0.94 cm?Marland Kitchen  Overall, probably moderate-to-severe stenosis.  Moderate regurgitation.  9. Mild pulmonic valve regurgitation.  10. Estimated peak systolic pulmonary artery pressure is 49 mm Hg (note is made that blood pressure is 166/97 mmHg at the time of the examination).  11. Mildly dilated aortic root.  The proximal ascending thoracic aorta size is normal.  12. No pericardial effusion.  ?  A direct visual comparison was performed with the most recent transthoracic echocardiogram from October 13, 2020.  Left ventricle function has slightly improved over the interval time period.  The severity of aortic valve stenosis and aortic valve regurgitation are fairly similar.  The degrees of both tricuspid and mitral valve regurgitation have slightly worsened over the interval time period.     R/LHC 08/18/20:  RA 16  RV 61/16  PA 58/24 with mean 35  PCWP 22  TPG 13  PVR 6.34  CO/CI 2.05/1.18 by thermo  CO/CI 1.8/1.1 by Fick  Moderate nonobstructive coronary artery disease involving the left anterior descending artery.    Problems Addressed Today  No diagnosis found.       Assessment/Plan:    HFrEF improved to 55% on echo 10/2021    NICM   - Estimated dry weight: 150 pounds  - mildly Euvolemic upon exam  - Diuretic regimen:   Home diuretics: Current Changes    Bumex 1 mg daily 11/3. Recommend patient take an additional dose of Bumex 1mg  today, 11/3 and tomorrow, 11/4. Then resume previous dose.        - NYHA class III, stage C symptoms  - GDMT:     GDMT Current Dose Changes   BB Coreg 12.5mg  BID, listed on medications. Unclear if patient is currently taking.     ACEI/ARB/ARNI Losartan 100mg  every day. listed on medications. Unclear if patient is currently taking.      Aldosterone Antagonist Spironlactone 50mg  every day.listed on medications. Unclear if patient is currently taking.      SGLT-2 Inhibitor  Farxiga 10 mg daily    Hydralazine/Nitrate  Hydralazine 25mg  TID listed on medications. Unclear if patient is currently taking.     Ivabradine       - Last echo: 08/31/21 as noted above  - Device: no device with EF of 45%  - Labs: Check BMP today.   >  Unclear if patient is taking medications as prescribed. She tells me she has not taken her medications today. Recommend she takes medications as soon as she gets home today.   > check BMP and Mag today.   > Mildly decompensated. Weight up 6 lbs.  Recommend patient take an additional dose of Bumex 1mg  today, 11/3 and tomorrow, 11/4. Then resume previous dose.     Nonischemic cardiomyopathy  Presumed but does have a history of breast cancer  PYP in April 2021 with no evidence of aTTR  ?  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  If no options may need to consider palliative care.  She has canceled several appointments with valve team (Dr Mackey Birchwood) due to hospitalizations or ER visits.  Previously recommended  she call and reschedule appointment.     CAD  Minimal nonobstructive disease 40% stenosis in mid LAD as above  Continue aspirin 81mg  every day and Lipitor 20mg  hs.   ?  Paroxysmal atrial fibrillation  -On Eliquis 5 mg twice daily for [facility]  -Has been referred for watchman previously but refused indefinitely  ?  Hypertension  Patient is very hypertensive today blood pressure is 192/94, Repeat 179/82   Unclear if patient is taking medications as prescribed. She tells me she has not taken her medications today. Recommend she takes medications as soon as she gets home today.       ?  History of DVT/PE  On AC with Eliquis  ?  Diabetes mellitus type 2  Lab Draw:  Lab Results   Component Value Date/Time    HGBA1C 10.3 (H) 08/31/2021 04:15 AM    HGBA1C 7.4 (H) 11/22/2020 12:47 PM    HGBA1C 8.8 (H) 08/30/2020 10:41 AM     Management per PCP.   ?  Dyslipidemia        Lab Results   Component Value Date   ? CHOL 140 08/31/2021   ? TRIG 87 08/31/2021   ? HDL 56 08/31/2021   ? LDL 71 08/31/2021   ? VLDL 17 08/31/2021   ? NONHDLCHOL 84 08/31/2021   ? CHOLHDLC 3 09/15/2018      Atorvastatin 20 mg daily  ?  CKD  Baseline mostly 0.8-1.1    ?Hypothyroidism  Takes levothyroxine 150 mcg daily  TSH 9.78 03/15/2022  Management per PCP.     ?  Iron deficiency anemia  Hemoglobin 11.5 on 09/13/2021    09/03/2021 Iron 36,% saturation 11, TIBC 341, ferritin 20 on 09/03/2021.   Venofer 300 mg x 3 doses (4/25-4/27) during hospitalization.   > Recheck iron studies next office viist.          Follow up appointment with Bedie Dominey in 3 weeks to reassess blood pressure. Recommend patient bring medications to next office visit. Also need to recheck iron studies.         There are no Patient Instructions on file for this visit.    Future Appointments   Date Time Provider Department Center   03/15/2022  1:30 PM Elvia Collum, APRN-NP [facility] CVM Exam   04/19/2022 10:00 AM Rod Can, PA-C [facility] IM  Total Time Today was 50 minutes in the following activities: Preparing to see the patient, Obtaining and/or reviewing separately obtained history, Performing a medically appropriate examination and/or evaluation, Counseling and educating the patient/family/caregiver, Ordering medications, tests, or procedures and Documenting clinical information in the electronic or other health record.    Elvia Collum, APRN-NP  [facility] APP  [facility]  Collaborating physicians: Drs. Zubair Shah/[employer]

## 2022-03-15 ENCOUNTER — Encounter: Admit: 2022-03-15 | Discharge: 2022-03-15 | Payer: BC Managed Care – PPO

## 2022-03-15 ENCOUNTER — Ambulatory Visit: Admit: 2022-03-15 | Discharge: 2022-03-15 | Payer: BC Managed Care – PPO

## 2022-03-15 DIAGNOSIS — I5042 Chronic combined systolic (congestive) and diastolic (congestive) heart failure: Secondary | ICD-10-CM

## 2022-03-15 DIAGNOSIS — J431 Panlobular emphysema: Secondary | ICD-10-CM

## 2022-03-15 DIAGNOSIS — C50919 Malignant neoplasm of unspecified site of unspecified female breast: Secondary | ICD-10-CM

## 2022-03-15 DIAGNOSIS — R0600 Dyspnea, unspecified: Secondary | ICD-10-CM

## 2022-03-15 DIAGNOSIS — I253 Aneurysm of heart: Secondary | ICD-10-CM

## 2022-03-15 DIAGNOSIS — M810 Age-related osteoporosis without current pathological fracture: Secondary | ICD-10-CM

## 2022-03-15 DIAGNOSIS — M797 Fibromyalgia: Secondary | ICD-10-CM

## 2022-03-15 DIAGNOSIS — I00 Rheumatic fever without heart involvement: Secondary | ICD-10-CM

## 2022-03-15 DIAGNOSIS — B999 Unspecified infectious disease: Secondary | ICD-10-CM

## 2022-03-15 DIAGNOSIS — I10 Essential (primary) hypertension: Secondary | ICD-10-CM

## 2022-03-15 DIAGNOSIS — I428 Other cardiomyopathies: Secondary | ICD-10-CM

## 2022-03-15 DIAGNOSIS — R51 Headache: Secondary | ICD-10-CM

## 2022-03-15 DIAGNOSIS — E039 Hypothyroidism, unspecified: Secondary | ICD-10-CM

## 2022-03-15 DIAGNOSIS — I4891 Unspecified atrial fibrillation: Secondary | ICD-10-CM

## 2022-03-15 DIAGNOSIS — I340 Nonrheumatic mitral (valve) insufficiency: Secondary | ICD-10-CM

## 2022-03-15 DIAGNOSIS — C539 Malignant neoplasm of cervix uteri, unspecified: Secondary | ICD-10-CM

## 2022-03-15 DIAGNOSIS — I5022 Chronic systolic (congestive) heart failure: Secondary | ICD-10-CM

## 2022-03-15 DIAGNOSIS — M549 Dorsalgia, unspecified: Secondary | ICD-10-CM

## 2022-03-15 DIAGNOSIS — E119 Type 2 diabetes mellitus without complications: Secondary | ICD-10-CM

## 2022-03-15 DIAGNOSIS — H547 Unspecified visual loss: Secondary | ICD-10-CM

## 2022-03-15 DIAGNOSIS — C801 Malignant (primary) neoplasm, unspecified: Secondary | ICD-10-CM

## 2022-03-15 DIAGNOSIS — M1990 Unspecified osteoarthritis, unspecified site: Secondary | ICD-10-CM

## 2022-03-15 DIAGNOSIS — G459 Transient cerebral ischemic attack, unspecified: Secondary | ICD-10-CM

## 2022-03-15 DIAGNOSIS — I358 Other nonrheumatic aortic valve disorders: Secondary | ICD-10-CM

## 2022-03-15 DIAGNOSIS — K8020 Calculus of gallbladder without cholecystitis without obstruction: Secondary | ICD-10-CM

## 2022-03-15 DIAGNOSIS — E785 Hyperlipidemia, unspecified: Secondary | ICD-10-CM

## 2022-03-15 LAB — THYROID STIMULATING HORMONE-TSH: TSH: 9.7 uU/mL — ABNORMAL HIGH (ref 0.35–5.00)

## 2022-03-15 LAB — BASIC METABOLIC PANEL
BLD UREA NITROGEN: 12 mg/dL (ref 7–25)
CALCIUM: 9.4 mg/dL (ref 8.5–10.6)
CO2: 29 MMOL/L (ref 21–30)
CREATININE: 0.9 mg/dL (ref 0.4–1.00)
EGFR: >60 mL/min (ref >60)
GLUCOSE,PANEL: 132 mg/dL — ABNORMAL HIGH (ref 70–100)
SODIUM: 140 MMOL/L (ref 137–147)

## 2022-03-15 LAB — MAGNESIUM: MAGNESIUM: 1.8 mg/dL (ref 1.6–2.6)

## 2022-03-15 LAB — FREE T4 (FREE THYROXINE) ONLY: FREE T4: 0.6 ng/dL (ref 0.6–1.6)

## 2022-03-18 ENCOUNTER — Encounter: Admit: 2022-03-18 | Discharge: 2022-03-18 | Payer: BC Managed Care – PPO

## 2022-03-18 NOTE — Telephone Encounter
1417-- Called and spoke to Kimberly Mitchell, spouse/EC and provided the following recommendations:    ----- Message from Alison Stalling, APRN-NP sent at 03/18/2022  1:39 PM CST -----  Labs are overall stable. Continue current plan.     Kimberly Mitchell, [facility] verbalizes understanding and denies any questions, concerns or needs at this time.

## 2022-03-18 NOTE — Telephone Encounter
-----   Message from Alison Stalling, APRN-NP sent at 03/18/2022  1:39 PM CST -----  Labs are overall stable. Continue current plan.   ----- Message -----  From: Laurin Coder, RN  Sent: 03/15/2022   3:59 PM CST  To: Sherlyn Lick Gentles, APRN-NP    OV with you today    Reccs:  Your Care Plan:    Medications: Recommend patient take an additional dose of Bumex 1 mg x2 days. Then resume prior dose.    Please take blood pressure medications as soon as you get home today.    Labs: BMP and Mag today.    Test: none.    Follow up appointment with [name] in 3 weeks to reassess blood pressure.     Meds:  Bumex 1 mg daily and PRN (take additional dose of Bumex 1 mg x 2 days)  Coreg 12.5 mg BID  Farxiga 10 mg daily  Hydralazine 25 mg TID  Losartan 100 mg daily  K-DUR 40 mEq  (and take additional 40 mEq on days that PRN Bumex is taken)  Arlyce Harman 50 mg daily    Thank you,  Larene Beach

## 2022-04-19 ENCOUNTER — Encounter: Admit: 2022-04-19 | Discharge: 2022-04-19 | Payer: MEDICARE

## 2022-04-19 ENCOUNTER — Ambulatory Visit: Admit: 2022-04-19 | Discharge: 2022-04-20 | Payer: MEDICARE

## 2022-04-19 DIAGNOSIS — I358 Other nonrheumatic aortic valve disorders: Secondary | ICD-10-CM

## 2022-04-19 DIAGNOSIS — M1990 Unspecified osteoarthritis, unspecified site: Secondary | ICD-10-CM

## 2022-04-19 DIAGNOSIS — R51 Headache: Secondary | ICD-10-CM

## 2022-04-19 DIAGNOSIS — G459 Transient cerebral ischemic attack, unspecified: Secondary | ICD-10-CM

## 2022-04-19 DIAGNOSIS — I00 Rheumatic fever without heart involvement: Secondary | ICD-10-CM

## 2022-04-19 DIAGNOSIS — I340 Nonrheumatic mitral (valve) insufficiency: Secondary | ICD-10-CM

## 2022-04-19 DIAGNOSIS — M549 Dorsalgia, unspecified: Secondary | ICD-10-CM

## 2022-04-19 DIAGNOSIS — E039 Hypothyroidism, unspecified: Secondary | ICD-10-CM

## 2022-04-19 DIAGNOSIS — E1165 Type 2 diabetes mellitus with hyperglycemia: Secondary | ICD-10-CM

## 2022-04-19 DIAGNOSIS — E119 Type 2 diabetes mellitus without complications: Secondary | ICD-10-CM

## 2022-04-19 DIAGNOSIS — C50919 Malignant neoplasm of unspecified site of unspecified female breast: Secondary | ICD-10-CM

## 2022-04-19 DIAGNOSIS — I253 Aneurysm of heart: Secondary | ICD-10-CM

## 2022-04-19 DIAGNOSIS — H547 Unspecified visual loss: Secondary | ICD-10-CM

## 2022-04-19 DIAGNOSIS — R0600 Dyspnea, unspecified: Secondary | ICD-10-CM

## 2022-04-19 DIAGNOSIS — B999 Unspecified infectious disease: Secondary | ICD-10-CM

## 2022-04-19 DIAGNOSIS — C801 Malignant (primary) neoplasm, unspecified: Secondary | ICD-10-CM

## 2022-04-19 DIAGNOSIS — I4891 Unspecified atrial fibrillation: Secondary | ICD-10-CM

## 2022-04-19 DIAGNOSIS — I5022 Chronic systolic (congestive) heart failure: Secondary | ICD-10-CM

## 2022-04-19 DIAGNOSIS — K8020 Calculus of gallbladder without cholecystitis without obstruction: Secondary | ICD-10-CM

## 2022-04-19 DIAGNOSIS — M797 Fibromyalgia: Secondary | ICD-10-CM

## 2022-04-19 DIAGNOSIS — I428 Other cardiomyopathies: Secondary | ICD-10-CM

## 2022-04-19 DIAGNOSIS — I10 Essential (primary) hypertension: Secondary | ICD-10-CM

## 2022-04-19 DIAGNOSIS — J431 Panlobular emphysema: Secondary | ICD-10-CM

## 2022-04-19 DIAGNOSIS — C539 Malignant neoplasm of cervix uteri, unspecified: Secondary | ICD-10-CM

## 2022-04-19 DIAGNOSIS — I5042 Chronic combined systolic (congestive) and diastolic (congestive) heart failure: Secondary | ICD-10-CM

## 2022-04-19 DIAGNOSIS — E785 Hyperlipidemia, unspecified: Secondary | ICD-10-CM

## 2022-04-19 DIAGNOSIS — M810 Age-related osteoporosis without current pathological fracture: Secondary | ICD-10-CM

## 2022-04-19 MED ORDER — BLOOD-GLUCOSE METER MISC MISC
1 | Freq: Before meals | 0 refills | Status: AC
Start: 2022-04-19 — End: ?

## 2022-04-19 MED ORDER — BLOOD GLUCOSE TEST MISC STRP
1 | Freq: Every day | 3 refills | 50.00000 days | Status: AC
Start: 2022-04-19 — End: ?

## 2022-04-19 MED ORDER — LANCETS MISC MISC
1 | Freq: Every day | 3 refills | Status: AC | PRN
Start: 2022-04-19 — End: ?

## 2022-04-19 MED ORDER — GABAPENTIN 300 MG PO CAP
300 mg | ORAL_CAPSULE | Freq: Three times a day (TID) | ORAL | 3 refills | Status: AC
Start: 2022-04-19 — End: ?

## 2022-04-20 DIAGNOSIS — E1165 Type 2 diabetes mellitus with hyperglycemia: Secondary | ICD-10-CM

## 2022-10-01 ENCOUNTER — Encounter: Admit: 2022-10-01 | Discharge: 2022-10-01 | Payer: MEDICARE

## 2022-10-01 DIAGNOSIS — E1165 Type 2 diabetes mellitus with hyperglycemia: Secondary | ICD-10-CM

## 2022-10-02 ENCOUNTER — Encounter: Admit: 2022-10-02 | Discharge: 2022-10-02 | Payer: MEDICARE

## 2022-10-02 ENCOUNTER — Emergency Department: Admit: 2022-10-02 | Discharge: 2022-10-02 | Payer: MEDICARE

## 2022-10-02 ENCOUNTER — Ambulatory Visit: Admit: 2022-10-02 | Discharge: 2022-10-03 | Payer: MEDICARE

## 2022-10-02 ENCOUNTER — Emergency Department: Admit: 2022-10-02 | Discharge: 2022-10-03 | Payer: MEDICARE

## 2022-10-02 DIAGNOSIS — I4891 Unspecified atrial fibrillation: Secondary | ICD-10-CM

## 2022-10-02 DIAGNOSIS — I272 Pulmonary hypertension, unspecified: Secondary | ICD-10-CM

## 2022-10-02 DIAGNOSIS — M797 Fibromyalgia: Secondary | ICD-10-CM

## 2022-10-02 DIAGNOSIS — I5023 Acute on chronic systolic (congestive) heart failure: Secondary | ICD-10-CM

## 2022-10-02 DIAGNOSIS — E039 Hypothyroidism, unspecified: Secondary | ICD-10-CM

## 2022-10-02 DIAGNOSIS — I10 Essential (primary) hypertension: Secondary | ICD-10-CM

## 2022-10-02 DIAGNOSIS — I5042 Chronic combined systolic (congestive) and diastolic (congestive) heart failure: Secondary | ICD-10-CM

## 2022-10-02 DIAGNOSIS — J431 Panlobular emphysema: Secondary | ICD-10-CM

## 2022-10-02 DIAGNOSIS — I358 Other nonrheumatic aortic valve disorders: Secondary | ICD-10-CM

## 2022-10-02 DIAGNOSIS — G459 Transient cerebral ischemic attack, unspecified: Secondary | ICD-10-CM

## 2022-10-02 DIAGNOSIS — I5043 Acute on chronic combined systolic (congestive) and diastolic (congestive) heart failure: Secondary | ICD-10-CM

## 2022-10-02 DIAGNOSIS — I428 Other cardiomyopathies: Secondary | ICD-10-CM

## 2022-10-02 DIAGNOSIS — M810 Age-related osteoporosis without current pathological fracture: Secondary | ICD-10-CM

## 2022-10-02 DIAGNOSIS — M549 Dorsalgia, unspecified: Secondary | ICD-10-CM

## 2022-10-02 DIAGNOSIS — E1165 Type 2 diabetes mellitus with hyperglycemia: Secondary | ICD-10-CM

## 2022-10-02 DIAGNOSIS — Z9889 Other specified postprocedural states: Secondary | ICD-10-CM

## 2022-10-02 DIAGNOSIS — R0600 Dyspnea, unspecified: Secondary | ICD-10-CM

## 2022-10-02 DIAGNOSIS — I340 Nonrheumatic mitral (valve) insufficiency: Secondary | ICD-10-CM

## 2022-10-02 DIAGNOSIS — I480 Paroxysmal atrial fibrillation: Secondary | ICD-10-CM

## 2022-10-02 DIAGNOSIS — E1140 Type 2 diabetes mellitus with diabetic neuropathy, unspecified: Secondary | ICD-10-CM

## 2022-10-02 DIAGNOSIS — I253 Aneurysm of heart: Secondary | ICD-10-CM

## 2022-10-02 DIAGNOSIS — I5022 Chronic systolic (congestive) heart failure: Secondary | ICD-10-CM

## 2022-10-02 DIAGNOSIS — I361 Nonrheumatic tricuspid (valve) insufficiency: Secondary | ICD-10-CM

## 2022-10-02 DIAGNOSIS — M1990 Unspecified osteoarthritis, unspecified site: Secondary | ICD-10-CM

## 2022-10-02 DIAGNOSIS — C801 Malignant (primary) neoplasm, unspecified: Secondary | ICD-10-CM

## 2022-10-02 DIAGNOSIS — B999 Unspecified infectious disease: Secondary | ICD-10-CM

## 2022-10-02 DIAGNOSIS — K8020 Calculus of gallbladder without cholecystitis without obstruction: Secondary | ICD-10-CM

## 2022-10-02 DIAGNOSIS — I2510 Atherosclerotic heart disease of native coronary artery without angina pectoris: Secondary | ICD-10-CM

## 2022-10-02 DIAGNOSIS — I00 Rheumatic fever without heart involvement: Secondary | ICD-10-CM

## 2022-10-02 DIAGNOSIS — C539 Malignant neoplasm of cervix uteri, unspecified: Secondary | ICD-10-CM

## 2022-10-02 DIAGNOSIS — E782 Mixed hyperlipidemia: Secondary | ICD-10-CM

## 2022-10-02 DIAGNOSIS — E119 Type 2 diabetes mellitus without complications: Secondary | ICD-10-CM

## 2022-10-02 DIAGNOSIS — C50919 Malignant neoplasm of unspecified site of unspecified female breast: Secondary | ICD-10-CM

## 2022-10-02 DIAGNOSIS — H547 Unspecified visual loss: Secondary | ICD-10-CM

## 2022-10-02 DIAGNOSIS — E785 Hyperlipidemia, unspecified: Secondary | ICD-10-CM

## 2022-10-02 DIAGNOSIS — R51 Headache: Secondary | ICD-10-CM

## 2022-10-02 DIAGNOSIS — I342 Nonrheumatic mitral (valve) stenosis: Secondary | ICD-10-CM

## 2022-10-02 DIAGNOSIS — I351 Nonrheumatic aortic (valve) insufficiency: Secondary | ICD-10-CM

## 2022-10-02 LAB — COMPREHENSIVE METABOLIC PANEL
ALBUMIN: 3.9 g/dL (ref 3.5–5.0)
ALK PHOSPHATASE: 93 U/L — ABNORMAL LOW (ref 25–110)
ALT: 12 U/L (ref 7–56)
ANION GAP: 9 K/UL (ref 3–12)
AST: 19 U/L (ref 7–40)
BLD UREA NITROGEN: 9 mg/dL (ref 7–25)
CALCIUM: 9.5 mg/dL — ABNORMAL HIGH (ref 8.5–10.6)
CHLORIDE: 102 MMOL/L — ABNORMAL LOW (ref 98–110)
CO2: 31 MMOL/L — ABNORMAL HIGH (ref 21–30)
CREATININE: 0.9 mg/dL (ref 0.4–1.00)
EGFR: >60 mL/min (ref >60)
GLUCOSE,PANEL: 111 mg/dL — ABNORMAL HIGH (ref 70–100)
POTASSIUM: 3.5 MMOL/L — ABNORMAL LOW (ref 3.5–5.1)
SODIUM: 142 MMOL/L (ref 137–147)
TOTAL BILIRUBIN: 0.6 mg/dL (ref 0.2–1.3)
TOTAL PROTEIN: 8.2 g/dL — ABNORMAL HIGH (ref 6.0–8.0)

## 2022-10-02 LAB — CBC AND DIFF
ABSOLUTE BASO COUNT: 0 K/UL (ref 0–0.20)
ABSOLUTE EOS COUNT: 0.1 K/UL (ref 0–0.45)
ABSOLUTE MONO COUNT: 0.4 K/UL (ref 0–0.80)
MDW (MONOCYTE DISTRIBUTION WIDTH): 17 (ref <20.7)
WBC COUNT: 6 K/UL (ref 4.5–11.0)

## 2022-10-02 LAB — HIGH SENSITIVITY TROPONIN I 2 HOUR
HIGH SENSITIVITY TROPONIN I 2 HOUR: 15 ng/L — ABNORMAL HIGH (ref <12)
HIGH SENSITIVITY TROPONIN I DELTA VALUE: 2

## 2022-10-02 LAB — HIGH SENSITIVITY TROPONIN I 0 HOUR: HIGH SENSITIVITY TROPONIN I 0 HOUR: 13 ng/L — ABNORMAL HIGH (ref <12)

## 2022-10-02 NOTE — ED Provider Notes
Chief Complaint:  Chief Complaint   Patient presents with    Hypertension     Rapid responded for HTN in clinic, new blurry vision. Supposed to be on BP meds and diuretics, hadn't taken them yet this AM       History of Present Illness:  selGladys SHRIKA Mitchell is a 82 y.o. 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 Patient reports she was in the endocrinology and diabetes clinic today and her systolic blood pressure was found to be in the 200s, so she was rapid responded to the ER for further evaluation. She states she had not taken her hydralazine this morning so she took it prior to her arrival in the ER. The patient endorses headaches, blurred vision, and shortness of breath with exertion since yesterday. She reports thinking her shortness of breath was related to her asthma with the recent weather changes. Denies fevers, chills, cough, chest pain, or generalized myalgias.      History provided by:  Patient and medical records  Language interpreter used: No    Hypertension  Associated symptoms: headaches and shortness of breath (with exertion)    Associated symptoms: no abdominal pain, no chest pain and no fever        Review of Systems:  Review of Systems   Constitutional:  Negative for chills and fever.   HENT:  Negative for sore throat.    Eyes:  Positive for visual disturbance (blurred).   Respiratory:  Positive for shortness of breath (with exertion). Negative for cough.    Cardiovascular:  Negative for chest pain.   Gastrointestinal:  Negative for abdominal pain.   Genitourinary:  Negative for dysuria.   Musculoskeletal:  Negative for back pain and myalgias.   Skin:  Negative for rash.   Neurological:  Positive for headaches.       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 No signs of renal artery stenosis.    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    Rheumatic fever     as a child     TIA (transient ischemic attack)     Type 2 diabetes mellitus (HCC) 07/27/2006    Valvular cardiomyopathy (HCC) 08/30/2020    Vision decreased  Past Surgical History:  Surgical History:   Procedure Laterality Date    ANGIOGRAPHY CORONARY ARTERY WITH RIGHT AND LEFT HEART CATHETERIZATION N/A 08/17/2020    Performed by Marcell Barlow, MD at [facility] CATH LAB    POSSIBLE PERCUTANEOUS CORONARY STENT PLACEMENT WITH ANGIOPLASTY N/A 08/17/2020    Performed by Marcell Barlow, MD at [facility] CATH LAB    Percutaneous Mitral Valve Repair with MitraClip/TEE N/A 09/01/2020    Performed by Lynne Logan, MD at [facility] CATH LAB    COLONOSCOPY      HX CHOLECYSTECTOMY      HX HEART CATHETERIZATION      HX HYSTERECTOMY      MASTECTOMY Right        Pertinent medical and surgical history reviewed    Social History:  Social History     Tobacco Use    Smoking status: Never    Smokeless tobacco: Never   Vaping Use    Vaping status: Never Used   Substance Use Topics    Alcohol use: No    Drug use: No     Social History     Substance and Sexual Activity   Drug Use No             Family History:  Family History   Problem Relation Name Age of Onset    Hypertension Mother      Heart Disease Mother      Cancer Father      Hypertension Father      Heart Disease Father      Cancer Sister      Heart Disease Sister      Hypertension Sister      Arthritis-rheumatoid Sister      Stroke Sister         Vitals:  ED Vitals      Date and Time T BP P RR SPO2P SPO2 User   10/02/22 2100 -- -- 72 -- -- 96 % LN   10/02/22 1930 -- 141/80 67 -- -- 95 % LN   10/02/22 1901 -- 146/81 67 17 PER MINUTE -- 93 % HC   10/02/22 1554 36.9 ?C (98.4 ?F) 189/87 77 16 PER MINUTE -- 100 % TA            Physical Exam:  Physical Exam  Vitals and nursing note reviewed.   Constitutional:       Appearance: Normal appearance. She is normal weight.   HENT:      Head: Normocephalic and atraumatic.   Eyes:      Extraocular Movements: Extraocular movements intact.      Conjunctiva/sclera: Conjunctivae normal.   Cardiovascular:      Rate and Rhythm: Normal rate and regular rhythm.      Pulses: Normal pulses.      Heart sounds: Normal heart sounds.   Pulmonary:      Effort: Pulmonary effort is normal.      Breath sounds: Normal breath sounds. No wheezing, rhonchi or rales.   Abdominal:      General: Bowel sounds are normal. There is no distension.      Palpations: Abdomen is soft.      Tenderness: There is no abdominal tenderness.   Musculoskeletal:         General: Normal range of motion.      Cervical back: Neck supple.   Skin:     General: Skin is warm and dry.   Neurological:      Mental  Status: She is oriented to person, place, and time. Mental status is at baseline.   Psychiatric:         Mood and Affect: Mood normal.         Behavior: Behavior normal.         Laboratory Results:  Labs Reviewed   CBC AND DIFF - Abnormal       Result Value Ref Range Status    White Blood Cells 6.0  4.5 - 11.0 K/UL Final    RBC 4.10 4.0 - 5.0 M/UL Final    Hemoglobin 11.8 (*) 12.0 - 15.0 GM/DL Final    Hematocrit 91.4 (*) 36 - 45 % Final    MCV 86.2  80 - 100 FL Final    MCH 28.8  26 - 34 PG Final    MCHC 33.4  32.0 - 36.0 G/DL Final    RDW 78.2 (*) 11 - 15 % Final    Platelet Count 197  150 - 400 K/UL Final    MPV 8.9  7 - 11 FL Final    Neutrophils 69  41 - 77 % Final    Lymphocytes 20 (*) 24 - 44 % Final    Monocytes 7  4 - 12 % Final    Eosinophils 3  0 - 5 % Final    Basophils 1  0 - 2 % Final    Absolute Neutrophil Count 4.13  1.8 - 7.0 K/UL Final    Absolute Lymph Count 1.20  1.0 - 4.8 K/UL Final    Absolute Monocyte Count 0.42  0 - 0.80 K/UL Final    Absolute Eosinophil Count 0.17  0 - 0.45 K/UL Final    Absolute Basophil Count 0.05  0 - 0.20 K/UL Final    MDW (Monocyte Distribution Width) 17.8  <20.7 Final   COMPREHENSIVE METABOLIC PANEL - Abnormal    Sodium 142  137 - 147 MMOL/L Final    Potassium 3.5  3.5 - 5.1 MMOL/L Final    Chloride 102  98 - 110 MMOL/L Final    Glucose 111 (*) 70 - 100 MG/DL Final    Blood Urea Nitrogen 9  7 - 25 MG/DL Final    Creatinine 9.56  0.4 - 1.00 MG/DL Final    Calcium 9.5  8.5 - 10.6 MG/DL Final    Total Protein 8.2 (*) 6.0 - 8.0 G/DL Final    Total Bilirubin 0.6  0.2 - 1.3 MG/DL Final    Albumin 3.9  3.5 - 5.0 G/DL Final    Alk Phosphatase 93  25 - 110 U/L Final    AST (SGOT) 19  7 - 40 U/L Final    CO2 31 (*) 21 - 30 MMOL/L Final    ALT (SGPT) 12  7 - 56 U/L Final    Anion Gap 9  3 - 12 Final    eGFR >60  >60 mL/min Final   HIGH SENSITIVITY TROPONIN I 2 HOUR - Abnormal    hs Troponin I 2 Hour 15 (*) <12 ng/L Final    hs Troponin I 0-2hr Delta Value 2   Final   HIGH SENSITIVITY TROPONIN I 0 HOUR - Abnormal    hs Troponin I 0 Hour 13 (*) <12 ng/L Final   NT-PRO-BNP - Abnormal    NT-Pro-BNP 2,626.0 (*) <450 pg/mL Final   URINALYSIS DIPSTICK REFLEX TO CULTURE   URINALYSIS MICROSCOPIC REFLEX TO CULTURE   UA GREY TOP TUBE   CLEAR TOP  EXTRA URINE TUBE   HIGH SENSITIVITY TROPONIN I 4 HR          Radiology Interpretation:  CT HEAD WO CONTRAST   Final Result         1.  No evidence of acute intracranial hemorrhage or mass effect.   2.  Unchanged mild patchy cerebral white matter hypodensities with tiny chronic left thalamic lacunar type infarct, likely sequela of chronic small vessel ischemia   3.  Obstructive right maxillary sinus disease, with internal hyperdense elements, which may reflect inspissated secretions or benign fungal elements.   4.  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 Follow-up CT maxillofacial could be performed for further evaluation as clinically indicated.          Finalized by Despina Arias, D.O. on 10/02/2022 5:13 PM. Dictated by Despina Arias, D.O. on 10/02/2022 4:37 PM.         CHEST SINGLE VIEW   Final Result         Persistent moderate cardiomegaly without evidence of overt acute CHF or consolidating pneumonia.          Finalized by Jason Coop, M.D. on 10/02/2022 5:00 PM. Dictated by Jason Coop, M.D. on 10/02/2022 4:59 PM.             EKG:      Medical Decision Making:  Kimberly Mitchell is a 82 y.o. female who presents with chief complaint as listed above. Based on the history and presentation, the list of differential diagnoses considered included, but was not limited to, ACS, intracerebral hemorrhage, hypertensive emergency     ED Course   Patient was roomed and placed on both the cardiac and pulse oximetry monitors  PIV access was obtained by nursing staff  Vital signs were reviewed as above  Pertinent available records were reviewed  Patient was seen and evaluated after nursing triage by Resident & Attending Physician   -   Obtained noted workup as above to further evaluate the patient's concerns. Symptom mitigation, treatment, & hemodynamic support with the above listed medications & therapies.    Patient re-evaluated after workup and treatment - findings from workup discussed and reassurance was provided. Answered any questions to the best of my ability. Discussed plan for discharge and any necessary follow up. Recommended they establish care with a PCP if they have not already. Patient & any present family members/caretakers reports they feel comfortable with this plan.  At the time of re-evaluation, vitals show improved blood pressure and are appropriate for discharge. Repeat focused physical examination unchanged when compared to initial assessment.  Gave education and return precautions in regards to symptomatic hypertension.. Patient verbalized with me that they will return to this facility if symptom burden worsens or new symptoms emerge. Patient able to ambulate through the ED without significant discomfort. Patient displaying a steady gait without assistance       MDM   Patient presenting as a rapid response to clinic for elevated blood pressures.  Elevated pressures likely secondary to missed medications this morning.  Patient's initial blood pressure was 220/101 and she was reporting some shortness of breath and vision changes.  For this reason CT of the head performed and lab work was drawn.  CT negative for acute intracranial hemorrhage or other acute findings.  CBC and CMP unremarkable.  Slight elevation in BNP but this appears baseline for her.  She has no overt lower extremity  edema and no pulmonary interstitial edema on chest x-ray.  I have lower concern for acute heart failure exacerbation.  Troponins elevated but this appears to be baseline for patient.  No active chest pain at this time.  Patient reports she felt she is having an asthma exacerbation but she has no wheezes on exam is on room air with no respiratory distress.  Lower concern for acute asthma exacerbation.  Patient blood pressure improved after taking her home medications during the rapid response.  Patient reports she feels comfortable and like symptoms at home.  Patient is overall appropriate for trial of outpatient symptom management and follow-up in clinic.     Disposition   Discharge         ED Course         Complexity of Problems Addressed  Patient's active diagnoses as well as contributing pre-existing medical problems include:  Clinical Impression   Hypertension, unspecified type   Essential hypertension   Atrial septal aneurysm   Dyspnea, unspecified type   Nonrheumatic tricuspid valve regurgitation   Mild CAD   Nonrheumatic mitral valve stenosis   Paroxysmal atrial fibrillation (HCC)   Acute on chronic HFrEF (heart failure with reduced ejection fraction) (HCC)   Valvular cardiomyopathy (HCC)   Acute on chronic combined systolic (congestive) and diastolic (congestive) heart failure (HCC)   Pulmonary hypertension (HCC)   Acquired hypothyroidism   Type 2 diabetes mellitus with hyperglycemia, without long-term current use of insulin (HCC)   Nonrheumatic aortic valve insufficiency   Mixed hyperlipidemia   Panlobular emphysema (HCC)   Chronic combined systolic (congestive) and diastolic (congestive) heart failure (HCC)   Moderate mitral regurgitation   S/P mitral valve clip implantation     Evaluation performed for potential threat to life or bodily function during this visit given the initial differential diagnosis and clinical impression(s) as discussed previously in MDM/ED course.    Additional data reviewed:    History was obtained from an independent historian: Spouse  Prior non-ED notes reviewed: Recent discharge summary and/or H&P and Clinic note  Independent interpretation of diagnostic tests was performed by me: Not in addition to what is mentioned above  Patient presentation/management was discussed with the following qualified health care professionals and/or other relevant professionals: Not in addition to what is mentioned above    Risk evaluation:    Diagnosis or treatment of patient condition impacted by social determinant of health: Problems related to health literacy  Tests Considered but not performed due to clinical scoring (if not mentioned in ED course, aside from what is implied by clinical scores listed):   Rationale regarding whether admission or escalation of care considered if not performed (if not mentioned in ED course, aside from what is implied by clinical scores listed):     ED Scoring:                                Facility Administered Meds:  Medications - No data to display    Clinical Impression:  Clinical Impression   Hypertension, unspecified type   Essential hypertension   Atrial septal aneurysm   Dyspnea, unspecified type   Nonrheumatic tricuspid valve regurgitation   Mild CAD   Nonrheumatic mitral valve stenosis   Paroxysmal atrial fibrillation (HCC)   Acute on chronic HFrEF (heart failure with reduced ejection fraction) (HCC)   Valvular cardiomyopathy (HCC)   Acute on chronic combined systolic (congestive) and diastolic (congestive) heart  failure (HCC)   Pulmonary hypertension (HCC)   Acquired hypothyroidism   Type 2 diabetes mellitus with hyperglycemia, without long-term current use of insulin (HCC)   Nonrheumatic aortic valve insufficiency   Mixed hyperlipidemia   Panlobular emphysema (HCC)   Chronic combined systolic (congestive) and diastolic (congestive) heart failure (HCC)   Moderate mitral regurgitation   S/P mitral valve clip implantation       Disposition/Follow up  ED Disposition     ED Disposition   Discharge           Erskine Emery, MD  [address]  [address]  [place] [address]  [phone]            Medications:  Discharge Medication List as of 10/02/2022  8:56 PM          Procedure Notes:  Procedures       Attestation / Supervision:  Julieta Bellini, am scribing for and in the presence of Dondra Spry, MD.    Morrison Old, MD  Emergency Medicine PGY2  [facility] [id number]  10/02/2022    Note has been documented by Deanne Coffer on 10/02/2022

## 2022-10-02 NOTE — ED Notes
ED Initial Provider Note:    This patient was seen in the ED triage area to initiate and expedite the patients ED care when possible.    ED Chief Complaint:   Chief Complaint   Patient presents with    Hypertension     Rapid responded for HTN in clinic, new blurry vision. Supposed to be on BP meds and diuretics, hadn't taken them yet this AM       S: Kimberly Mitchell is a 82 y.o. female who presents to the Emergency Department for elevated blood pressure and blurred vision.  Patient was in the clinic today.  She was getting her blood pressure taken when she began to feel somewhat lightheaded.  No shortness of breath or chest pain.  She did have some diarrhea last week.  No current abdominal pain or blood in the stool.  Patient is on Eliquis.    PMHx:  Past Medical History:   Diagnosis Date    Aortic valve sclerosis     Arthritis     Atrial fibrillation (HCC) 10/13/2020    Atrial septal aneurysm     Back pain     Breast cancer (HCC)     Cancer of cervix (HCC)     Chronic combined systolic (congestive) and diastolic (congestive) heart failure (HCC) 07/10/2020    Chronic systolic heart failure (HCC) 07/10/2020    Diabetes (HCC)     Dyspnea     Essential hypertension 07/27/2006    02/06 renal ultrasound: Normal segmental arterial wave forms in both kidneys. No signs of renal artery stenosis.    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    O: Brief Physical: Alert female answering questions in complete sentences alert and oriented.  Neck without tenderness normal range of motion.  No work of breathing.    A/P: The patient was seen by me as an initial provider in triage. A brief history and physical was obtained. My exam is intended to be an initial medial screening exam. Initial orders have been placed by me. My working diagnosis is hypertension.    The patient is deemed appropriate for the main ED. The patient's care will be resumed by the ED provider care team once the patient is roomed in the ED. A more detailed / complete H&P will be documented by those providers.

## 2022-10-02 NOTE — Response Teams
Rapid Response Team Progress Note    Date: 10/02/2022 Time: 3:56 PM  Patient: Kimberly Mitchell  Attending: No att. providers found Service: Emergency Medicine  Admission Date: (Not on file)  LOS: 0 days    A Code/Rapid Response Timeline Event Report has been created for this patient on 10/02/22 at 1524. Patient was in the endocrine clinic when her blood pressure was noted to be in the 200s. Patient reportedly told staff that she did not take her hydralazine but she had it with her and took it in the presence of the staff. When RRT arrived, patient alert and oriented with complaints of blurred vision and pressure remains elevated at 220/101. Patient would like to be seen/ monitored in the ED and ED tech, Onalee Hua assisted with transport to triage.     Otila Back, RN

## 2022-10-02 NOTE — ED Notes
Pt is 82 y/o female who came into the ED today with c/o HTN. Patient reports she was at the clinic where they found her BP to be in the 200's and rapid responded her to the ED. Patient endorses intermittent blurry vision and HA. Patient has a known hx of hypertension and reports she forgot to take her BP meds and took them PTA. Pt denies CP, SOB, N/V/D, constipation, abdominal pain, and/ or fever/ chills. Pt is alert and oriented x 4, breathing is non-labored, skin is appropriate age and ethnicity. Pt resting in bed, locked in lowest position, side rails up, call light in reach. Patient assumes all belongings at bedside.

## 2023-01-17 ENCOUNTER — Encounter: Admit: 2023-01-17 | Discharge: 2023-01-17 | Payer: MEDICARE

## 2023-01-22 ENCOUNTER — Encounter: Admit: 2023-01-22 | Discharge: 2023-01-22 | Payer: MEDICARE

## 2023-01-22 DIAGNOSIS — E1140 Type 2 diabetes mellitus with diabetic neuropathy, unspecified: Secondary | ICD-10-CM

## 2023-03-29 ENCOUNTER — Encounter: Admit: 2023-03-29 | Discharge: 2023-03-29 | Payer: MEDICARE

## 2024-06-01 ENCOUNTER — Ambulatory Visit: Admit: 2024-06-01 | Discharge: 2024-06-01 | Payer: MEDICARE

## 2024-06-01 ENCOUNTER — Encounter: Admit: 2024-06-01 | Discharge: 2024-06-01 | Payer: MEDICARE
